# Patient Record
Sex: Female | Born: 1960
Health system: Southern US, Community
[De-identification: ages and names within clinical notes are randomized; demographics above are authoritative.]

## PROBLEM LIST (undated history)

## (undated) DIAGNOSIS — C4492 Squamous cell carcinoma of skin, unspecified: Secondary | ICD-10-CM

## (undated) DIAGNOSIS — C801 Malignant (primary) neoplasm, unspecified: Secondary | ICD-10-CM

## (undated) DIAGNOSIS — L570 Actinic keratosis: Secondary | ICD-10-CM

## (undated) HISTORY — PX: ABDOMINAL HYSTERECTOMY: SHX81

## (undated) HISTORY — DX: Squamous cell carcinoma of skin, unspecified: C44.92

## (undated) HISTORY — PX: OOPHORECTOMY: SHX86

## (undated) HISTORY — DX: Actinic keratosis: L57.0

---

## 1998-06-07 ENCOUNTER — Ambulatory Visit (HOSPITAL_COMMUNITY): Admission: RE | Admit: 1998-06-07 | Discharge: 1998-06-07 | Payer: Self-pay | Admitting: Cardiovascular Disease

## 1998-06-07 ENCOUNTER — Encounter: Payer: Self-pay | Admitting: Cardiovascular Disease

## 1998-07-12 ENCOUNTER — Inpatient Hospital Stay (HOSPITAL_COMMUNITY): Admission: RE | Admit: 1998-07-12 | Discharge: 1998-07-14 | Payer: Self-pay | Admitting: Obstetrics & Gynecology

## 1999-06-02 ENCOUNTER — Other Ambulatory Visit: Admission: RE | Admit: 1999-06-02 | Discharge: 1999-06-02 | Payer: Self-pay | Admitting: Obstetrics & Gynecology

## 2004-12-26 ENCOUNTER — Ambulatory Visit: Payer: Self-pay | Admitting: Internal Medicine

## 2005-01-25 ENCOUNTER — Ambulatory Visit: Payer: Self-pay | Admitting: Internal Medicine

## 2005-02-15 ENCOUNTER — Ambulatory Visit: Payer: Self-pay | Admitting: Unknown Physician Specialty

## 2006-10-19 ENCOUNTER — Emergency Department: Payer: Self-pay | Admitting: Emergency Medicine

## 2006-10-22 ENCOUNTER — Emergency Department: Payer: Self-pay | Admitting: Emergency Medicine

## 2006-10-25 ENCOUNTER — Ambulatory Visit: Payer: Self-pay | Admitting: Internal Medicine

## 2006-10-26 ENCOUNTER — Emergency Department: Payer: Self-pay | Admitting: Emergency Medicine

## 2006-11-02 ENCOUNTER — Emergency Department: Payer: Self-pay | Admitting: Emergency Medicine

## 2006-11-05 ENCOUNTER — Ambulatory Visit: Payer: Self-pay | Admitting: Internal Medicine

## 2006-11-16 ENCOUNTER — Emergency Department: Payer: Self-pay | Admitting: Emergency Medicine

## 2007-03-07 DIAGNOSIS — C801 Malignant (primary) neoplasm, unspecified: Secondary | ICD-10-CM

## 2007-03-07 HISTORY — DX: Malignant (primary) neoplasm, unspecified: C80.1

## 2007-05-30 ENCOUNTER — Ambulatory Visit: Payer: Self-pay | Admitting: Internal Medicine

## 2007-08-30 ENCOUNTER — Ambulatory Visit: Payer: Self-pay | Admitting: Physician Assistant

## 2008-06-01 ENCOUNTER — Ambulatory Visit: Payer: Self-pay | Admitting: Internal Medicine

## 2009-03-19 ENCOUNTER — Emergency Department: Payer: Self-pay

## 2009-12-09 ENCOUNTER — Ambulatory Visit: Payer: Self-pay | Admitting: Internal Medicine

## 2011-11-08 ENCOUNTER — Ambulatory Visit: Payer: Self-pay | Admitting: Internal Medicine

## 2011-11-16 ENCOUNTER — Ambulatory Visit: Payer: Self-pay | Admitting: Otolaryngology

## 2011-11-16 LAB — CREATININE, SERUM
Creatinine: 0.58 mg/dL — ABNORMAL LOW (ref 0.60–1.30)
EGFR (Non-African Amer.): >60

## 2012-10-18 ENCOUNTER — Other Ambulatory Visit: Payer: Self-pay | Admitting: *Deleted

## 2012-10-18 MED ORDER — LOVASTATIN 20 MG PO TABS: 20.0000 mg | ORAL_TABLET | Freq: Every day | ORAL | Status: DC

## 2012-12-03 ENCOUNTER — Other Ambulatory Visit: Payer: Self-pay | Admitting: *Deleted

## 2012-12-09 ENCOUNTER — Other Ambulatory Visit: Payer: Self-pay | Admitting: *Deleted

## 2012-12-09 ENCOUNTER — Other Ambulatory Visit: Payer: Self-pay

## 2012-12-09 ENCOUNTER — Ambulatory Visit: Payer: Self-pay | Admitting: Endocrinology

## 2012-12-09 MED ORDER — METFORMIN HCL 850 MG PO TABS: 850.0000 mg | ORAL_TABLET | Freq: Two times a day (BID) | ORAL | Status: DC

## 2012-12-11 ENCOUNTER — Encounter: Payer: Self-pay | Admitting: *Deleted

## 2012-12-18 ENCOUNTER — Other Ambulatory Visit: Payer: Self-pay

## 2012-12-30 ENCOUNTER — Encounter: Payer: Self-pay | Admitting: Endocrinology

## 2012-12-30 ENCOUNTER — Ambulatory Visit (INDEPENDENT_AMBULATORY_CARE_PROVIDER_SITE_OTHER): Payer: 59 | Admitting: Endocrinology

## 2012-12-30 ENCOUNTER — Other Ambulatory Visit (INDEPENDENT_AMBULATORY_CARE_PROVIDER_SITE_OTHER): Payer: 59

## 2012-12-30 VITALS — BP 130/82 | HR 76 | Temp 98.3°F | Resp 10 | Ht 66.0 in | Wt 207.5 lb

## 2012-12-30 DIAGNOSIS — IMO0001 Reserved for inherently not codable concepts without codable children: Secondary | ICD-10-CM

## 2012-12-30 DIAGNOSIS — H8109 Meniere's disease, unspecified ear: Secondary | ICD-10-CM

## 2012-12-30 DIAGNOSIS — E785 Hyperlipidemia, unspecified: Secondary | ICD-10-CM | POA: Insufficient documentation

## 2012-12-30 LAB — MICROALBUMIN / CREATININE URINE RATIO
Creatinine,U: 120.1 mg/dL
Microalb Creat Ratio: 0.7 mg/g (ref 0.0–30.0)

## 2012-12-30 LAB — COMPREHENSIVE METABOLIC PANEL
ALT: 15 U/L (ref 0–35)
AST: 15 U/L (ref 0–37)
Albumin: 4.6 g/dL (ref 3.5–5.2)
BUN: 17 mg/dL (ref 6–23)
CO2: 27 mEq/L (ref 19–32)
Calcium: 9.8 mg/dL (ref 8.4–10.5)
Chloride: 102 mEq/L (ref 96–112)
Creatinine, Ser: 0.6 mg/dL (ref 0.4–1.2)
GFR: 112.88 mL/min (ref >60.00)
Potassium: 4.3 mEq/L (ref 3.5–5.1)

## 2012-12-30 LAB — URINALYSIS
Bilirubin Urine: NEGATIVE
Hgb urine dipstick: NEGATIVE
Ketones, ur: NEGATIVE
Leukocytes, UA: NEGATIVE
Nitrite: NEGATIVE
pH: 5.5 (ref 5.0–8.0)

## 2012-12-30 NOTE — Progress Notes (Signed)
Patient ID: Carrie Soto, female   DOB: 01-10-1961, 52 y.o.   MRN: [id number]  Carrie Soto is an 52 y.o. female.   Reason for Appointment: Diabetes follow-up   History of Present Illness   Diagnosis: Type 2 DIABETES MELITUS, date of diagnosis:  2007     Previous history: She was initially treated with Actos and glipizide but because of poor control she was changed to Actoplusmet, Amaryl and Onglyza With this her blood sugars have been reasonably well-controlled On her last visit she was having high readings because of taking prednisone but detailed records are not available  Recent history: Earlier this year she was given Invokana also especially because of her difficulty with losing weight She thinks her blood sugars have been overall better and on her own she stopped Onglyza She thinks that blood sugars are occasionally slightly low but her only symptoms will be feeling irritable and then she will eat something And lowest recorded glucose recently is 63 Her blood sugars are not higher even when she takes an occasional prednisone for her Mnire's disease She thinks her weight has gone down about 2 pounds  Oral hypoglycemic drugs: Actoplusmet, metformin and Invokana        Side effects from medications: None         Proper timing of medications in relation to meals: Yes.          Monitors blood glucose: Once a day.    Glucometer: One Touch.          Blood Glucose readings from meter download: readings before breakfast: 92-120, midday 70-129, 5 PM = 63  Hypoglycemia frequency: Very occasionally with minimal symptoms, no shakiness or sweating      Meals: 3 meals per day.          Physical activity: exercise: Walking 2/7 days a week on treadmill           Dietician visit: Most recent: 04/2009             Wt Readings from Last 3 Encounters:  12/30/12 207 lb 8 oz (94.121 kg)    LABS:  No results found for any previous visit.    Medication List       This list is  accurate as of: 12/30/12  1:59 PM.  Always use your most recent med list.               ACTOPLUS MET XR 15-1000 MG Tb24  Generic drug:  Pioglitazone HCl-Metformin HCl     amoxicillin 875 MG tablet  Commonly known as:  AMOXIL     aspirin 81 MG tablet  Take 81 mg by mouth daily.     INVOKANA 300 MG Tabs  Generic drug:  Canagliflozin  300 mg.     lovastatin 20 MG tablet  Commonly known as:  MEVACOR  Take 1 tablet (20 mg total) by mouth at bedtime.     metFORMIN 850 MG tablet  Commonly known as:  GLUCOPHAGE  Take 1 tablet (850 mg total) by mouth 2 (two) times daily with a meal.     multivitamin with minerals Tabs tablet  Take 1 tablet by mouth daily.     promethazine 12.5 MG tablet  Commonly known as:  PHENERGAN  12.5 mg.        Allergies: No Known Allergies  No past medical history on file.  No past surgical history on file.  No family history on file.  Social History:  reports  that she has never smoked. She has never used smokeless tobacco. Her alcohol and drug histories are not on file.  Review of Systems:  Hypertension:  has not been on any treatment for this  Lipids: Has been treated with lovastatin 20 mg  Meniere's disease: She has had occasional symptoms      Examination:   BP 130/82  Pulse 76  Temp(Src) 98.3 F (36.8 C)  Resp 10  Ht 5\' 6"  (1.676 m)  Wt 207 lb 8 oz (94.121 kg)  BMI 33.51 kg/m2  SpO2 98%  Body mass index is 33.51 kg/(m^2).    ASSESSMENT/ PLAN::   Diabetes type 2   Blood glucose control appears excellent with mostly normal readings at home Because of her tendency to hypoglycemia will stop her additional metformin tablet She is doing well with her diet and exercise regimen and her weight is overall better than over the last few years with adding Invokana  Libby Goehring 12/30/2012, 1:59 PM   Addendum: Labs as follows  Appointment on 12/30/2012  Component Date Value Range Status  . Color, Urine 12/30/2012 LT. YELLOW   Yellow;Lt. Yellow Final  . APPearance 12/30/2012 CLEAR  Clear Final  . Specific Gravity, Urine 12/30/2012 1.025  1.000-1.030 Final  . pH 12/30/2012 5.5  5.0 - 8.0 Final  . Total Protein, Urine 12/30/2012 NEGATIVE  Negative Final  . Urine Glucose 12/30/2012 >=1000  Negative Final  . Ketones, ur 12/30/2012 NEGATIVE  Negative Final  . Bilirubin Urine 12/30/2012 NEGATIVE  Negative Final  . Hgb urine dipstick 12/30/2012 NEGATIVE  Negative Final  . Urobilinogen, UA 12/30/2012 0.2  0.0 - 1.0 Final  . Leukocytes, UA 12/30/2012 NEGATIVE  Negative Final  . Nitrite 12/30/2012 NEGATIVE  Negative Final  . Microalb, Ur 12/30/2012 0.9  0.0 - 1.9 mg/dL Final  . Creatinine,U 16/12/9602 120.1   Final  . Microalb Creat Ratio 12/30/2012 0.7  0.0 - 30.0 mg/g Final  . Hemoglobin A1C 12/30/2012 6.1  4.6 - 6.5 % Final   Glycemic Control Guidelines for People with Diabetes:Non Diabetic:  <6%Goal of Therapy: <7%Additional Action Suggested:  >8%   . Sodium 12/30/2012 139  135 - 145 mEq/L Final  . Potassium 12/30/2012 4.3  3.5 - 5.1 mEq/L Final  . Chloride 12/30/2012 102  96 - 112 mEq/L Final  . CO2 12/30/2012 27  19 - 32 mEq/L Final  . Glucose, Bld 12/30/2012 115* 70 - 99 mg/dL Final  . BUN 54/11/8117 17  6 - 23 mg/dL Final  . Creatinine, Ser 12/30/2012 0.6  0.4 - 1.2 mg/dL Final  . Total Bilirubin 12/30/2012 2.0* 0.3 - 1.2 mg/dL Final  . Alkaline Phosphatase 12/30/2012 71  39 - 117 U/L Final  . AST 12/30/2012 15  0 - 37 U/L Final  . ALT 12/30/2012 15  0 - 35 U/L Final  . Total Protein 12/30/2012 7.2  6.0 - 8.3 g/dL Final  . Albumin 14/78/2956 4.6  3.5 - 5.2 g/dL Final  . Calcium 21/30/8657 9.8  8.4 - 10.5 mg/dL Final  . GFR 84/69/6295 112.88  >60.00 mL/min Final

## 2012-12-30 NOTE — Patient Instructions (Signed)
Stop Metformin as long as am sugar <130  Exercise daily

## 2012-12-31 ENCOUNTER — Telehealth: Payer: Self-pay | Admitting: *Deleted

## 2012-12-31 DIAGNOSIS — H8109 Meniere's disease, unspecified ear: Secondary | ICD-10-CM | POA: Insufficient documentation

## 2012-12-31 NOTE — Progress Notes (Signed)
Quick Note:  Please let patient know that the A1c result is normal and no further action needed ______

## 2012-12-31 NOTE — Telephone Encounter (Signed)
Message copied by Hermenia Bers on Tue Dec 31, 2012 11:54 AM ------      Message from: Reather Littler      Created: Tue Dec 31, 2012 11:34 AM       Please let patient know that the A1c result is normal and no further action needed ------

## 2012-12-31 NOTE — Telephone Encounter (Signed)
Left message with sister to return call for results

## 2013-01-09 ENCOUNTER — Other Ambulatory Visit: Payer: Self-pay | Admitting: *Deleted

## 2013-01-09 ENCOUNTER — Telehealth: Payer: Self-pay | Admitting: Endocrinology

## 2013-01-09 MED ORDER — PIOGLITAZONE HCL-METFORMIN ER 15-1000 MG PO TB24: ORAL_TABLET | ORAL | Status: DC

## 2013-01-09 NOTE — Telephone Encounter (Signed)
rx sent

## 2013-01-16 ENCOUNTER — Encounter: Payer: Self-pay | Admitting: *Deleted

## 2013-01-21 ENCOUNTER — Other Ambulatory Visit: Payer: Self-pay | Admitting: *Deleted

## 2013-01-21 MED ORDER — PIOGLITAZONE HCL-METFORMIN ER 15-1000 MG PO TB24: ORAL_TABLET | ORAL | Status: DC

## 2013-03-03 ENCOUNTER — Other Ambulatory Visit: Payer: Self-pay | Admitting: *Deleted

## 2013-03-03 MED ORDER — CANAGLIFLOZIN 300 MG PO TABS: 300.0000 mg | ORAL_TABLET | Freq: Every day | ORAL | Status: DC

## 2013-03-31 ENCOUNTER — Ambulatory Visit: Payer: 59 | Admitting: Endocrinology

## 2013-03-31 ENCOUNTER — Other Ambulatory Visit: Payer: 59

## 2013-04-04 ENCOUNTER — Ambulatory Visit (INDEPENDENT_AMBULATORY_CARE_PROVIDER_SITE_OTHER): Payer: 59 | Admitting: Endocrinology

## 2013-04-04 ENCOUNTER — Encounter: Payer: Self-pay | Admitting: Endocrinology

## 2013-04-04 ENCOUNTER — Other Ambulatory Visit (INDEPENDENT_AMBULATORY_CARE_PROVIDER_SITE_OTHER): Payer: 59

## 2013-04-04 VITALS — BP 102/56 | HR 86 | Temp 98.3°F | Resp 16 | Ht 66.0 in | Wt 217.0 lb

## 2013-04-04 DIAGNOSIS — E785 Hyperlipidemia, unspecified: Secondary | ICD-10-CM

## 2013-04-04 DIAGNOSIS — E1165 Type 2 diabetes mellitus with hyperglycemia: Principal | ICD-10-CM

## 2013-04-04 DIAGNOSIS — IMO0001 Reserved for inherently not codable concepts without codable children: Secondary | ICD-10-CM

## 2013-04-04 LAB — BASIC METABOLIC PANEL
BUN: 19 mg/dL (ref 6–23)
CALCIUM: 9.4 mg/dL (ref 8.4–10.5)
CO2: 21 mEq/L (ref 19–32)
Chloride: 109 mEq/L (ref 96–112)
Creatinine, Ser: 0.9 mg/dL (ref 0.4–1.2)
GFR: 67.54 mL/min (ref >60.00)
Glucose, Bld: 168 mg/dL — ABNORMAL HIGH (ref 70–99)
Potassium: 3.8 mEq/L (ref 3.5–5.1)
SODIUM: 139 meq/L (ref 135–145)

## 2013-04-04 LAB — HEMOGLOBIN A1C: HEMOGLOBIN A1C: 6.1 % (ref 4.6–6.5)

## 2013-04-04 NOTE — Progress Notes (Signed)
Patient ID: Carrie Soto, female   DOB: [birth year], 53 y.o.   MRN: [id number]   Reason for Appointment: Diabetes follow-up   History of Present Illness   Diagnosis: Type 2 DIABETES MELITUS, date of diagnosis:  2007     Previous history: She was initially treated with Actos and glipizide but because of poor control she was changed to Actoplusmet, Amaryl and Onglyza With this her blood sugars have been reasonably well-controlled In 2014  she was given Invokana also mostly because of her difficulty with losing weight Blood blood sugar control was subsequently overall better and on her own she stopped Onglyza  Recent history:  Although her A1c generally stays in the normal range she tends to have periodic high readings Most of her high readings are when she takes prednisone for her Mnire's disease Recently has taken prednisone about 3 times in the last month, usually takes one dose at bedtime Her blood sugars appear to be higher the next day She has only 3 blood sugars on a monitor in the last month and not clear what her level of control is However has gained significant amount of weight from poor diet and not exercising On her last visit metformin was reduced by 500 mg because of symptoms suggestive of low blood sugars occasionally and she has not had these symptoms  Oral hypoglycemic drugs: Actoplusmet, metformin and Invokana        Side effects from medications: None              Monitors blood glucose: Once a day.    Glucometer: One Touch.          Blood Glucose readings from meter download: Range 123-232 in the afternoon with only one high reading  Hypoglycemia: None recently Meals: 3 meals per day.          Physical activity: exercise: Walking rarely on treadmill           Dietician visit: Most recent: 04/2009             Wt Readings from Last 3 Encounters:  04/04/13 217 lb (98.431 kg)  12/30/12 207 lb 8 oz (94.121 kg)    LABS:  Lab Results  Component Value Date    HGBA1C 6.1 04/04/2013   HGBA1C 6.1 12/30/2012   Lab Results  Component Value Date   MICROALBUR 0.9 12/30/2012   CREATININE 0.9 04/04/2013    Appointment on 04/04/2013  Component Date Value Range Status  . Hemoglobin A1C 04/04/2013 6.1  4.6 - 6.5 % Final   Glycemic Control Guidelines for People with Diabetes:Non Diabetic:  <6%Goal of Therapy: <7%Additional Action Suggested:  >8%   . Sodium 04/04/2013 139  135 - 145 mEq/L Final  . Potassium 04/04/2013 3.8  3.5 - 5.1 mEq/L Final  . Chloride 04/04/2013 109  96 - 112 mEq/L Final  . CO2 04/04/2013 21  19 - 32 mEq/L Final  . Glucose, Bld 04/04/2013 168* 70 - 99 mg/dL Final  . BUN 04/04/2013 19  6 - 23 mg/dL Final  . Creatinine, Ser 04/04/2013 0.9  0.4 - 1.2 mg/dL Final  . Calcium 04/04/2013 9.4  8.4 - 10.5 mg/dL Final  . GFR 04/04/2013 67.54  >60.00 mL/min Final      Medication List       This list is accurate as of: 04/04/13 11:59 PM.  Always use your most recent med list.               aspirin 81  MG tablet  Take 81 mg by mouth daily.     Canagliflozin 300 MG Tabs  Commonly known as:  INVOKANA  Take 1 tablet (300 mg total) by mouth daily.     lovastatin 20 MG tablet  Commonly known as:  MEVACOR  Take 1 tablet (20 mg total) by mouth at bedtime.     multivitamin with minerals Tabs tablet  Take 1 tablet by mouth daily.     Pioglitazone HCl-Metformin HCl 15-1000 MG Tb24  Commonly known as:  ACTOPLUS MET XR  Take one tablet by mouth with evening meal     predniSONE 10 MG tablet  Commonly known as:  DELTASONE  Take 10 mg by mouth as needed.     promethazine 12.5 MG tablet  Commonly known as:  PHENERGAN  12.5 mg.        Allergies: No Known Allergies  No past medical history on file.  No past surgical history on file.  No family history on file.  Social History:  reports that she has never smoked. She has never used smokeless tobacco. Her alcohol and drug histories are not on file.  Review of  Systems:  Hypertension:  not present  Lipids: Has been treated with lovastatin 20 mg, no recent lipids available  Meniere's disease: She has had occasional symptoms and appears to get better with steroids      Examination:   BP 102/56  Pulse 86  Temp(Src) 98.3 F (36.8 C)  Resp 16  Ht $R'5\' 6"'pW$  (1.676 m)  Wt 217 lb (98.431 kg)  BMI 35.04 kg/m2  SpO2 95%  Body mass index is 35.04 kg/(m^2).    ASSESSMENT/ PLAN:   Diabetes type 2:   Blood glucose control needs to be assessed with A1c today She has gained weight and discussed that this may potentially cause worsening of her diabetes control This is despite her continuing to take Invokana which had previously helped with weight management She does need to get back into a routine of regular exercise and start watching her caloric intake  Control hyperglycemia related to prednisone keep a prescription of Amaryl 2 mg filled and will take the medication once or twice after taking the prednisone depending on the duration of hyperglycemia Advised her to call if she has persistently high readings  Hypercholesterolemia: To have lipids checked by PCP  [facility] 04/06/2013, 8:56 PM   Addendum: Labs as follows  Appointment on 04/04/2013  Component Date Value Range Status  . Hemoglobin A1C 04/04/2013 6.1  4.6 - 6.5 % Final   Glycemic Control Guidelines for People with Diabetes:Non Diabetic:  <6%Goal of Therapy: <7%Additional Action Suggested:  >8%   . Sodium 04/04/2013 139  135 - 145 mEq/L Final  . Potassium 04/04/2013 3.8  3.5 - 5.1 mEq/L Final  . Chloride 04/04/2013 109  96 - 112 mEq/L Final  . CO2 04/04/2013 21  19 - 32 mEq/L Final  . Glucose, Bld 04/04/2013 168* 70 - 99 mg/dL Final  . BUN 04/04/2013 19  6 - 23 mg/dL Final  . Creatinine, Ser 04/04/2013 0.9  0.4 - 1.2 mg/dL Final  . Calcium 04/04/2013 9.4  8.4 - 10.5 mg/dL Final  . GFR 04/04/2013 67.54  >60.00 mL/min Final

## 2013-04-04 NOTE — Patient Instructions (Addendum)
Please check blood sugars at least half the time about 2 hours after any meal and as directed on waking up. Please bring blood sugar monitor to each visit Restart exercise Glimeperide 2mg , take 1 twice a day as needed

## 2013-04-06 MED ORDER — GLIMEPIRIDE 2 MG PO TABS: 2.0000 mg | ORAL_TABLET | Freq: Every day | ORAL | Status: DC

## 2013-04-06 NOTE — Progress Notes (Signed)
Quick Note:  Please let patient know that the A1c same at 6.1, glucose 168, needs to monitor more often ______

## 2013-05-28 ENCOUNTER — Ambulatory Visit: Payer: Self-pay | Admitting: Internal Medicine

## 2013-06-06 ENCOUNTER — Ambulatory Visit: Payer: Self-pay | Admitting: Internal Medicine

## 2013-07-03 ENCOUNTER — Ambulatory Visit: Payer: 59 | Admitting: Endocrinology

## 2013-07-03 ENCOUNTER — Other Ambulatory Visit: Payer: 59

## 2013-08-05 ENCOUNTER — Other Ambulatory Visit: Payer: 59

## 2013-08-05 ENCOUNTER — Ambulatory Visit: Payer: 59 | Admitting: Endocrinology

## 2013-09-03 ENCOUNTER — Other Ambulatory Visit: Payer: 59

## 2013-09-03 ENCOUNTER — Ambulatory Visit: Payer: 59 | Admitting: Endocrinology

## 2013-10-08 ENCOUNTER — Ambulatory Visit: Payer: 59 | Admitting: Endocrinology

## 2013-10-08 ENCOUNTER — Encounter: Payer: Self-pay | Admitting: *Deleted

## 2013-10-08 ENCOUNTER — Other Ambulatory Visit: Payer: 59

## 2013-10-08 DIAGNOSIS — Z0289 Encounter for other administrative examinations: Secondary | ICD-10-CM

## 2013-10-27 ENCOUNTER — Other Ambulatory Visit: Payer: Self-pay | Admitting: *Deleted

## 2013-10-27 MED ORDER — LOVASTATIN 20 MG PO TABS: 20.0000 mg | ORAL_TABLET | Freq: Every day | ORAL | Status: DC

## 2013-10-31 ENCOUNTER — Other Ambulatory Visit (INDEPENDENT_AMBULATORY_CARE_PROVIDER_SITE_OTHER): Payer: 59

## 2013-10-31 DIAGNOSIS — E1165 Type 2 diabetes mellitus with hyperglycemia: Principal | ICD-10-CM

## 2013-10-31 DIAGNOSIS — IMO0001 Reserved for inherently not codable concepts without codable children: Secondary | ICD-10-CM

## 2013-10-31 LAB — COMPREHENSIVE METABOLIC PANEL
ALBUMIN: 4.3 g/dL (ref 3.5–5.2)
ALT: 19 U/L (ref 0–35)
AST: 18 U/L (ref 0–37)
Alkaline Phosphatase: 71 U/L (ref 39–117)
BUN: 19 mg/dL (ref 6–23)
CO2: 28 mEq/L (ref 19–32)
Calcium: 9.7 mg/dL (ref 8.4–10.5)
Chloride: 105 mEq/L (ref 96–112)
Creatinine, Ser: 0.7 mg/dL (ref 0.4–1.2)
GFR: 86.64 mL/min (ref >60.00)
Glucose, Bld: 119 mg/dL — ABNORMAL HIGH (ref 70–99)
Potassium: 4.3 mEq/L (ref 3.5–5.1)
Sodium: 140 mEq/L (ref 135–145)
Total Bilirubin: 2.8 mg/dL — ABNORMAL HIGH (ref 0.2–1.2)
Total Protein: 6.9 g/dL (ref 6.0–8.3)

## 2013-10-31 LAB — URINALYSIS, ROUTINE W REFLEX MICROSCOPIC
BILIRUBIN URINE: NEGATIVE
HGB URINE DIPSTICK: NEGATIVE
Ketones, ur: NEGATIVE
Leukocytes, UA: NEGATIVE
Nitrite: NEGATIVE
Specific Gravity, Urine: 1.02 (ref 1.000–1.030)
Total Protein, Urine: NEGATIVE
Urobilinogen, UA: 0.2 (ref 0.0–1.0)
pH: 5.5 (ref 5.0–8.0)

## 2013-10-31 LAB — HEMOGLOBIN A1C: Hgb A1c MFr Bld: 6 % (ref 4.6–6.5)

## 2013-11-06 ENCOUNTER — Encounter: Payer: Self-pay | Admitting: Endocrinology

## 2013-11-06 ENCOUNTER — Ambulatory Visit (INDEPENDENT_AMBULATORY_CARE_PROVIDER_SITE_OTHER): Payer: 59 | Admitting: Endocrinology

## 2013-11-06 VITALS — BP 116/68 | HR 85 | Temp 97.7°F | Resp 16 | Ht 63.0 in | Wt 216.4 lb

## 2013-11-06 DIAGNOSIS — E785 Hyperlipidemia, unspecified: Secondary | ICD-10-CM

## 2013-11-06 DIAGNOSIS — Z23 Encounter for immunization: Secondary | ICD-10-CM

## 2013-11-06 DIAGNOSIS — IMO0001 Reserved for inherently not codable concepts without codable children: Secondary | ICD-10-CM

## 2013-11-06 DIAGNOSIS — E1165 Type 2 diabetes mellitus with hyperglycemia: Principal | ICD-10-CM

## 2013-11-06 NOTE — Progress Notes (Signed)
Patient ID: Carrie Soto, female   DOB: 01/10/1961, 53 y.o.   MRN: [id number]   Reason for Appointment: Diabetes follow-up   History of Present Illness   Diagnosis: Type 2 DIABETES MELITUS, date of diagnosis:  2007     Previous history: She was initially treated with Actos and glipizide but because of poor control she was changed to Actoplusmet, Amaryl and Onglyza With this her blood sugars have been reasonably well-controlled In 2014  she was given Invokana also mostly because of her difficulty with losing weight Blood blood sugar control was subsequently overall better and on her own she stopped Onglyza  Recent history:  Has not been seen in followup since 1/15 She says she has been involved with family issues and has not been able to do much glucose monitoring or exercise because of this However has not gained any weight A1c is still excellent She has only 4 blood sugars at home, all fasting and fairly good She is taking only low dose metformin once a day and has benefited from adding Invokana  Oral hypoglycemic drugs: Actoplusmet, and Invokana        Side effects from medications: None              Monitors blood glucose:  irregular.    Glucometer: One Touch.          Blood Glucose readings from meter download: Range 102-122, ALT before first meal  Hypoglycemia: None recently Meals: 3 meals per day.          Physical activity: exercise: Walking rarely on treadmill, recently starting back           Dietician visit: Most recent: 04/2009             Wt Readings from Last 3 Encounters:  11/06/13 216 lb 6.4 oz (98.158 kg)  04/04/13 217 lb (98.431 kg)  12/30/12 207 lb 8 oz (94.121 kg)    LABS:  Lab Results  Component Value Date   HGBA1C 6.0 10/31/2013   HGBA1C 6.1 04/04/2013   HGBA1C 6.1 12/30/2012   Lab Results  Component Value Date   MICROALBUR 0.9 12/30/2012   CREATININE 0.7 10/31/2013    Appointment on 10/31/2013  Component Date Value Ref Range Status  . Sodium  10/31/2013 140  135 - 145 mEq/L Final  . Potassium 10/31/2013 4.3  3.5 - 5.1 mEq/L Final  . Chloride 10/31/2013 105  96 - 112 mEq/L Final  . CO2 10/31/2013 28  19 - 32 mEq/L Final  . Glucose, Bld 10/31/2013 119* 70 - 99 mg/dL Final  . BUN 10/31/2013 19  6 - 23 mg/dL Final  . Creatinine, Ser 10/31/2013 0.7  0.4 - 1.2 mg/dL Final  . Total Bilirubin 10/31/2013 2.8* 0.2 - 1.2 mg/dL Final  . Alkaline Phosphatase 10/31/2013 71  39 - 117 U/L Final  . AST 10/31/2013 18  0 - 37 U/L Final  . ALT 10/31/2013 19  0 - 35 U/L Final  . Total Protein 10/31/2013 6.9  6.0 - 8.3 g/dL Final  . Albumin 10/31/2013 4.3  3.5 - 5.2 g/dL Final  . Calcium 10/31/2013 9.7  8.4 - 10.5 mg/dL Final  . GFR 10/31/2013 86.64  >60.00 mL/min Final  . Hemoglobin A1C 10/31/2013 6.0  4.6 - 6.5 % Final   Glycemic Control Guidelines for People with Diabetes:Non Diabetic:  <6%Goal of Therapy: <7%Additional Action Suggested:  >8%   . Color, Urine 10/31/2013 YELLOW  Yellow;Lt. Yellow Final  . APPearance 10/31/2013  CLEAR  Clear Final  . Specific Gravity, Urine 10/31/2013 1.020  1.000-1.030 Final  . pH 10/31/2013 5.5  5.0 - 8.0 Final  . Total Protein, Urine 10/31/2013 NEGATIVE  Negative Final  . Urine Glucose 10/31/2013 >=1000* Negative Final  . Ketones, ur 10/31/2013 NEGATIVE  Negative Final  . Bilirubin Urine 10/31/2013 NEGATIVE  Negative Final  . Hgb urine dipstick 10/31/2013 NEGATIVE  Negative Final  . Urobilinogen, UA 10/31/2013 0.2  0.0 - 1.0 Final  . Leukocytes, UA 10/31/2013 NEGATIVE  Negative Final  . Nitrite 10/31/2013 NEGATIVE  Negative Final  . WBC, UA 10/31/2013 0-2/hpf  0-2/hpf Final  . RBC / HPF 10/31/2013 0-2/hpf  0-2/hpf Final  . Mucus, UA 10/31/2013 Presence of* None Final  . Bacteria, UA 10/31/2013 Rare(<10/hpf)* None Final  . Granular Casts, UA 10/31/2013 Presence of* None Final      Medication List       This list is accurate as of: 11/06/13  2:46 PM.  Always use your most recent med list.                aspirin 81 MG tablet  Take 81 mg by mouth daily.     Canagliflozin 300 MG Tabs  Commonly known as:  INVOKANA  Take 1 tablet (300 mg total) by mouth daily.     lovastatin 20 MG tablet  Commonly known as:  MEVACOR  Take 1 tablet (20 mg total) by mouth at bedtime.     multivitamin with minerals Tabs tablet  Take 1 tablet by mouth daily.     Pioglitazone HCl-Metformin HCl 15-1000 MG Tb24  Commonly known as:  ACTOPLUS MET XR  Take one tablet by mouth with evening meal     predniSONE 10 MG tablet  Commonly known as:  DELTASONE  Take 10 mg by mouth as needed.     promethazine 12.5 MG tablet  Commonly known as:  PHENERGAN  12.5 mg.     vitamin C 1000 MG tablet  Take 1,000 mg by mouth daily.     VITAMIN D PO  Take 600 Units by mouth.        Allergies:  Allergies  Allergen Reactions  . Clarithromycin Diarrhea    No past medical history on file.  No past surgical history on file.  No family history on file.  Social History:  reports that she has never smoked. She has never used smokeless tobacco. Her alcohol and drug histories are not on file.  Review of Systems:  Eye exams normal, has been regular  Hypertension:  not present  Lipids: Has been treated with lovastatin 20 mg, no recent lipids available  LDL in 2/14= 77  Meniere's disease: She has had less symptoms, none for 6 months  and appears to get better with steroids      Examination:   BP 116/68  Pulse 85  Temp(Src) 97.7 F (36.5 C)  Resp 16  Ht _0  (1.6 m)  Wt 216 lb 6.4 oz (98.158 kg)  BMI 38.34 kg/m2  SpO2 94%  Body mass index is 38.34 kg/(m^2).    ASSESSMENT/ PLAN:   Diabetes type 2:   Blood glucose control  is excellent with upper normal A1c This is despite her not checking blood sugar, exercising regularly and inconsistent diet In the last month has been better overall and has not gained any weight back Also probably better controlled if not getting steroids in the last 6  months  Hypercholesterolemia: To have lipids checked by  PCP and she will bring her records with her  [facility] 11/06/2013, 2:46 PM

## 2013-11-06 NOTE — Patient Instructions (Signed)
Please check blood sugars at least half the time about 2 hours after any meal and times per week on waking up. Please bring blood sugar monitor to each visit

## 2013-11-07 ENCOUNTER — Other Ambulatory Visit: Payer: Self-pay | Admitting: *Deleted

## 2013-11-07 ENCOUNTER — Telehealth: Payer: Self-pay | Admitting: Endocrinology

## 2013-11-07 MED ORDER — GLUCOSE BLOOD VI STRP: ORAL_STRIP | Status: DC

## 2013-11-07 MED ORDER — LOVASTATIN 20 MG PO TABS: ORAL_TABLET | ORAL | Status: DC

## 2013-11-07 MED ORDER — ONETOUCH ULTRASOFT LANCETS MISC: Status: DC

## 2013-11-07 NOTE — Telephone Encounter (Signed)
Patient called stating she is at pharmacy and her prescriptions were never sent in  20 mg levostatin  Test strips and lancets   Pharmacy: [facility]   Thank you

## 2014-04-09 ENCOUNTER — Other Ambulatory Visit: Payer: Self-pay | Admitting: Endocrinology

## 2014-05-04 ENCOUNTER — Other Ambulatory Visit: Payer: Self-pay | Admitting: Endocrinology

## 2014-05-04 ENCOUNTER — Other Ambulatory Visit: Payer: Self-pay

## 2014-05-04 MED ORDER — CANAGLIFLOZIN 300 MG PO TABS
300.0000 mg | ORAL_TABLET | Freq: Every day | ORAL | Status: DC
Start: 2014-05-04 — End: 2014-11-10

## 2014-05-04 NOTE — Telephone Encounter (Signed)
Rx request for Invokana 300 mg tablet- Take 1 tablet by mouth every morning before eating.  #90  Pharm:  [facility] [address]   Rx sent to pharmacy.

## 2014-05-07 ENCOUNTER — Ambulatory Visit: Payer: 59 | Admitting: Endocrinology

## 2014-05-07 ENCOUNTER — Other Ambulatory Visit: Payer: 59

## 2014-06-03 ENCOUNTER — Other Ambulatory Visit (INDEPENDENT_AMBULATORY_CARE_PROVIDER_SITE_OTHER): Payer: 59

## 2014-06-03 ENCOUNTER — Encounter: Payer: Self-pay | Admitting: Endocrinology

## 2014-06-03 ENCOUNTER — Ambulatory Visit (INDEPENDENT_AMBULATORY_CARE_PROVIDER_SITE_OTHER): Payer: 59 | Admitting: Endocrinology

## 2014-06-03 VITALS — BP 132/84 | HR 81 | Temp 97.7°F | Ht 63.0 in | Wt 217.0 lb

## 2014-06-03 DIAGNOSIS — E1165 Type 2 diabetes mellitus with hyperglycemia: Secondary | ICD-10-CM | POA: Diagnosis not present

## 2014-06-03 DIAGNOSIS — E119 Type 2 diabetes mellitus without complications: Secondary | ICD-10-CM

## 2014-06-03 LAB — POCT URINALYSIS DIPSTICK
Bilirubin, UA: NEGATIVE
Glucose, UA: 1000
Ketones, UA: NEGATIVE
LEUKOCYTES UA: NEGATIVE
Nitrite, UA: NEGATIVE
PROTEIN UA: NEGATIVE
RBC UA: NEGATIVE
Spec Grav, UA: 1.015
Urobilinogen, UA: NEGATIVE
pH, UA: 5

## 2014-06-03 LAB — COMPREHENSIVE METABOLIC PANEL
ALT: 13 U/L (ref 0–35)
AST: 13 U/L (ref 0–37)
Albumin: 4.5 g/dL (ref 3.5–5.2)
Alkaline Phosphatase: 74 U/L (ref 39–117)
BILIRUBIN TOTAL: 2.2 mg/dL — AB (ref 0.2–1.2)
BUN: 23 mg/dL (ref 6–23)
CO2: 31 mEq/L (ref 19–32)
CREATININE: 0.73 mg/dL (ref 0.40–1.20)
Calcium: 9.9 mg/dL (ref 8.4–10.5)
Chloride: 104 mEq/L (ref 96–112)
GFR: 87.82 mL/min (ref >60.00)
Glucose, Bld: 107 mg/dL — ABNORMAL HIGH (ref 70–99)
POTASSIUM: 4 meq/L (ref 3.5–5.1)
Sodium: 137 mEq/L (ref 135–145)
Total Protein: 6.9 g/dL (ref 6.0–8.3)

## 2014-06-03 LAB — MICROALBUMIN / CREATININE URINE RATIO
CREATININE, U: 90.1 mg/dL
MICROALB UR: 0.9 mg/dL (ref 0.0–1.9)
MICROALB/CREAT RATIO: 1 mg/g (ref 0.0–30.0)

## 2014-06-03 LAB — HEMOGLOBIN A1C: HEMOGLOBIN A1C: 6 % (ref 4.6–6.5)

## 2014-06-03 NOTE — Progress Notes (Signed)
Patient ID: Carrie Soto, female   DOB: 04/01/1960, 54 y.o.   MRN: [id number]   Reason for Appointment: Diabetes follow-up   History of Present Illness   Diagnosis: Type 2 DIABETES MELITUS, date of diagnosis:  2007     Previous history: She was initially treated with Actos and glipizide but because of poor control she was changed to Actoplusmet, Amaryl and Onglyza With this her blood sugars have been reasonably well-controlled In 2014  she was given Invokana also mostly because of her difficulty with losing weight Blood blood sugar control was subsequently overall better and on her own she stopped Onglyza  Recent history:  Has not been seen in followup since 9/15 She generally has fairly stable upper normal A1c readings with current regimen of low-dose Actoplusmet along with Invokana Previously had benefited from [place] with better control and some weight loss which she has maintained She is again asking about stopping one of her medications She is not checking her blood sugar again as directed and has only a few morning readings She has just started doing a little exercise; diet is fair. Oral hypoglycemic drugs: Actoplusmet, and Invokana        Side effects from medications: None              Monitors blood glucose:  irregular.    Glucometer: One Touch.          Blood Glucose readings from meter download: Range 103-164 with only 5 readings and average 126           Physical activity: exercise: Walking some on treadmill, recently 2/7            Dietician visit: Most recent: 04/2009             Wt Readings from Last 3 Encounters:  06/03/14 217 lb (98.431 kg)  11/06/13 216 lb 6.4 oz (98.158 kg)  04/04/13 217 lb (98.431 kg)    LABS:  Lab Results  Component Value Date   HGBA1C 6.0 10/31/2013   HGBA1C 6.1 04/04/2013   HGBA1C 6.1 12/30/2012   Lab Results  Component Value Date   MICROALBUR 0.9 12/30/2012   CREATININE 0.7 10/31/2013    No visits with results within 1  Week(s) from this visit. Latest known visit with results is:  Appointment on 10/31/2013  Component Date Value Ref Range Status  . Sodium 10/31/2013 140  135 - 145 mEq/L Final  . Potassium 10/31/2013 4.3  3.5 - 5.1 mEq/L Final  . Chloride 10/31/2013 105  96 - 112 mEq/L Final  . CO2 10/31/2013 28  19 - 32 mEq/L Final  . Glucose, Bld 10/31/2013 119* 70 - 99 mg/dL Final  . BUN 10/31/2013 19  6 - 23 mg/dL Final  . Creatinine, Ser 10/31/2013 0.7  0.4 - 1.2 mg/dL Final  . Total Bilirubin 10/31/2013 2.8* 0.2 - 1.2 mg/dL Final  . Alkaline Phosphatase 10/31/2013 71  39 - 117 U/L Final  . AST 10/31/2013 18  0 - 37 U/L Final  . ALT 10/31/2013 19  0 - 35 U/L Final  . Total Protein 10/31/2013 6.9  6.0 - 8.3 g/dL Final  . Albumin 10/31/2013 4.3  3.5 - 5.2 g/dL Final  . Calcium 10/31/2013 9.7  8.4 - 10.5 mg/dL Final  . GFR 10/31/2013 86.64  >60.00 mL/min Final  . Hgb A1c MFr Bld 10/31/2013 6.0  4.6 - 6.5 % Final   Glycemic Control Guidelines for People with Diabetes:Non Diabetic:  <6%Goal of Therapy: <  7%Additional Action Suggested:  >8%   . Color, Urine 10/31/2013 YELLOW  Yellow;Lt. Yellow Final  . APPearance 10/31/2013 CLEAR  Clear Final  . Specific Gravity, Urine 10/31/2013 1.020  1.000-1.030 Final  . pH 10/31/2013 5.5  5.0 - 8.0 Final  . Total Protein, Urine 10/31/2013 NEGATIVE  Negative Final  . Urine Glucose 10/31/2013 >=1000* Negative Final  . Ketones, ur 10/31/2013 NEGATIVE  Negative Final  . Bilirubin Urine 10/31/2013 NEGATIVE  Negative Final  . Hgb urine dipstick 10/31/2013 NEGATIVE  Negative Final  . Urobilinogen, UA 10/31/2013 0.2  0.0 - 1.0 Final  . Leukocytes, UA 10/31/2013 NEGATIVE  Negative Final  . Nitrite 10/31/2013 NEGATIVE  Negative Final  . WBC, UA 10/31/2013 0-2/hpf  0-2/hpf Final  . RBC / HPF 10/31/2013 0-2/hpf  0-2/hpf Final  . Mucus, UA 10/31/2013 Presence of* None Final  . Bacteria, UA 10/31/2013 Rare(<10/hpf)* None Final  . Granular Casts, UA 10/31/2013 Presence of*  None Final      Medication List       This list is accurate as of: 06/03/14  4:04 PM.  Always use your most recent med list.               ACTOPLUS MET XR 15-1000 MG Tb24  Generic drug:  Pioglitazone HCl-Metformin HCl  TAKE ONE TABLET BY MOUTH WITH EVENING MEAL     aspirin 81 MG tablet  Take 81 mg by mouth daily.     canagliflozin 300 MG Tabs tablet  Commonly known as:  INVOKANA  Take 300 mg by mouth daily.     glucose blood test strip  Commonly known as:  ONE TOUCH ULTRA TEST  Use as instructed to check blood sugar 2 times per day     lovastatin 20 MG tablet  Commonly known as:  MEVACOR  TAKE ONE TABLET AT BEDTIME DAILY     multivitamin with minerals Tabs tablet  Take 1 tablet by mouth daily.     onetouch ultrasoft lancets  Use as instructed to check blood sugar 2 times per day     predniSONE 10 MG tablet  Commonly known as:  DELTASONE  Take 10 mg by mouth as needed.     promethazine 12.5 MG tablet  Commonly known as:  PHENERGAN  12.5 mg.     vitamin C 1000 MG tablet  Take 1,000 mg by mouth daily.     VITAMIN D PO  Take 600 Units by mouth.        Allergies:  Allergies  Allergen Reactions  . Clarithromycin Diarrhea    No past medical history on file.  No past surgical history on file.  No family history on file.  Social History:  reports that she has never smoked. She has never used smokeless tobacco. Her alcohol and drug histories are not on file.  Review of Systems:  Eye exams normal, has been regular  Hypertension:  not present  Lipids: Has been treated with lovastatin 20 mg, no recent lipids available  No results found for: CHOL, HDL, LDLCALC, LDLDIRECT, TRIG, CHOLHDL  LDL in 2/14= 77  Meniere's disease: She has had less symptoms, last episode 2 weeks ago  and appears to get better with steroids      Examination:   BP 132/84 mmHg  Pulse 81  Temp(Src) 97.7 F (36.5 C) (Oral)  Ht $R'5\' 3"'oO$  (1.6 m)  Wt 217 lb (98.431 kg)  BMI  38.45 kg/m2  SpO2 97%  Body mass index is 38.45 kg/(m^2).  ASSESSMENT/ PLAN:   Diabetes type 2:   Blood glucose control needs to be assessed with A1c Home blood sugars look fairly good and her weight is stable She is starting to exercise and encouraged her to do this more regularly Would like to continue on her 3 drug regimen since she really does have insulin resistance being treated with Actoplusmet and has had good response to glucose control and weight loss with Invokana Encouraged her to check sugars after meals and also more often when she takes steroids  Hypercholesterolemia: To have lipids checked   Patient Instructions  Please check blood sugars at least half the time about 2 hours after any meal and 3 times per week on waking up. Please bring blood sugar monitor to each visit. Recommended blood sugar levels about 2 hours after meal is 140-180 and on waking up 90-130      Darrin Koman 06/03/2014, 4:04 PM    Addendum: Labs as follows  Lab on 06/03/2014  Component Date Value Ref Range Status  . Hgb A1c MFr Bld 06/03/2014 6.0  4.6 - 6.5 % Final   Glycemic Control Guidelines for People with Diabetes:Non Diabetic:  <6%Goal of Therapy: <7%Additional Action Suggested:  >8%   . Sodium 06/03/2014 137  135 - 145 mEq/L Final  . Potassium 06/03/2014 4.0  3.5 - 5.1 mEq/L Final  . Chloride 06/03/2014 104  96 - 112 mEq/L Final  . CO2 06/03/2014 31  19 - 32 mEq/L Final  . Glucose, Bld 06/03/2014 107* 70 - 99 mg/dL Final  . BUN 06/03/2014 23  6 - 23 mg/dL Final  . Creatinine, Ser 06/03/2014 0.73  0.40 - 1.20 mg/dL Final  . Total Bilirubin 06/03/2014 2.2* 0.2 - 1.2 mg/dL Final  . Alkaline Phosphatase 06/03/2014 74  39 - 117 U/L Final  . AST 06/03/2014 13  0 - 37 U/L Final  . ALT 06/03/2014 13  0 - 35 U/L Final  . Total Protein 06/03/2014 6.9  6.0 - 8.3 g/dL Final  . Albumin 06/03/2014 4.5  3.5 - 5.2 g/dL Final  . Calcium 06/03/2014 9.9  8.4 - 10.5 mg/dL Final  . GFR 06/03/2014  87.82  >60.00 mL/min Final  . Color, UA 06/03/2014 Yellow   Final  . Clarity, UA 06/03/2014 Cloudy   Final  . Glucose, UA 06/03/2014 1000   Final  . Bilirubin, UA 06/03/2014 Neg   Final  . Ketones, UA 06/03/2014 Neg   Final  . Spec Grav, UA 06/03/2014 1.015   Final  . Blood, UA 06/03/2014 Neg   Final  . pH, UA 06/03/2014 5.0   Final  . Protein, UA 06/03/2014 Neg   Final  . Urobilinogen, UA 06/03/2014 negative   Final  . Nitrite, UA 06/03/2014 Neg   Final  . Leukocytes, UA 06/03/2014 Negative   Final  . Microalb, Ur 06/03/2014 0.9  0.0 - 1.9 mg/dL Final  . Creatinine,U 06/03/2014 90.1   Final  . Microalb Creat Ratio 06/03/2014 1.0  0.0 - 30.0 mg/g Final

## 2014-06-03 NOTE — Patient Instructions (Signed)
Please check blood sugars at least half the time about 2 hours after any meal and 3 times per week on waking up.  Please bring blood sugar monitor to each visit.  Recommended blood sugar levels about 2 hours after meal is 140-180 and on waking up 90-130   

## 2014-06-04 ENCOUNTER — Other Ambulatory Visit (INDEPENDENT_AMBULATORY_CARE_PROVIDER_SITE_OTHER): Payer: 59

## 2014-06-04 DIAGNOSIS — R937 Abnormal findings on diagnostic imaging of other parts of musculoskeletal system: Secondary | ICD-10-CM | POA: Diagnosis not present

## 2014-06-04 LAB — LIPID PANEL
CHOL/HDL RATIO: 3
Cholesterol: 170 mg/dL (ref 0–200)
HDL: 65.8 mg/dL (ref >39.00)
LDL Cholesterol: 71 mg/dL (ref 0–99)
NonHDL: 104.2
Triglycerides: 164 mg/dL — ABNORMAL HIGH (ref 0.0–149.0)
VLDL: 32.8 mg/dL (ref 0.0–40.0)

## 2014-06-06 NOTE — Progress Notes (Signed)
Quick Note:  Please let patient know that the lab result is normal and no further action needed, fax to PCP ______

## 2014-06-14 NOTE — Progress Notes (Signed)
Quick Note:  Please let patient know that the A1c is excellent at 6% and kidneys are okay Forward to PCP ______

## 2014-09-23 ENCOUNTER — Other Ambulatory Visit: Payer: Self-pay | Admitting: Endocrinology

## 2014-10-30 ENCOUNTER — Other Ambulatory Visit: Payer: Self-pay | Admitting: Endocrinology

## 2014-11-10 ENCOUNTER — Other Ambulatory Visit: Payer: Self-pay | Admitting: *Deleted

## 2014-11-10 MED ORDER — CANAGLIFLOZIN 300 MG PO TABS: 300.0000 mg | ORAL_TABLET | Freq: Every day | ORAL | Status: DC

## 2014-12-04 ENCOUNTER — Telehealth: Payer: Self-pay | Admitting: Endocrinology

## 2014-12-04 ENCOUNTER — Other Ambulatory Visit: Payer: Self-pay | Admitting: *Deleted

## 2014-12-04 ENCOUNTER — Ambulatory Visit (INDEPENDENT_AMBULATORY_CARE_PROVIDER_SITE_OTHER): Payer: 59 | Admitting: Endocrinology

## 2014-12-04 ENCOUNTER — Encounter: Payer: Self-pay | Admitting: Endocrinology

## 2014-12-04 VITALS — BP 120/76 | HR 70 | Temp 98.2°F | Resp 16 | Ht 66.0 in | Wt 223.0 lb

## 2014-12-04 DIAGNOSIS — Z23 Encounter for immunization: Secondary | ICD-10-CM | POA: Diagnosis not present

## 2014-12-04 DIAGNOSIS — E1165 Type 2 diabetes mellitus with hyperglycemia: Secondary | ICD-10-CM

## 2014-12-04 DIAGNOSIS — E559 Vitamin D deficiency, unspecified: Secondary | ICD-10-CM

## 2014-12-04 DIAGNOSIS — E119 Type 2 diabetes mellitus without complications: Secondary | ICD-10-CM

## 2014-12-04 DIAGNOSIS — IMO0002 Reserved for concepts with insufficient information to code with codable children: Secondary | ICD-10-CM

## 2014-12-04 LAB — BASIC METABOLIC PANEL
BUN: 20 mg/dL (ref 6–23)
CHLORIDE: 106 meq/L (ref 96–112)
CO2: 27 mEq/L (ref 19–32)
CREATININE: 0.68 mg/dL (ref 0.40–1.20)
Calcium: 9.1 mg/dL (ref 8.4–10.5)
GFR: 95.14 mL/min (ref >60.00)
Glucose, Bld: 128 mg/dL — ABNORMAL HIGH (ref 70–99)
Potassium: 4.3 mEq/L (ref 3.5–5.1)
Sodium: 140 mEq/L (ref 135–145)

## 2014-12-04 LAB — POCT GLYCOSYLATED HEMOGLOBIN (HGB A1C): HEMOGLOBIN A1C: 5.6

## 2014-12-04 LAB — VITAMIN D 25 HYDROXY (VIT D DEFICIENCY, FRACTURES): VITD: 46.1 ng/mL (ref 30.00–100.00)

## 2014-12-04 MED ORDER — GLUCOSE BLOOD VI STRP: ORAL_STRIP | Status: DC

## 2014-12-04 MED ORDER — ONETOUCH DELICA LANCING DEV MISC: Status: DC

## 2014-12-04 NOTE — Progress Notes (Signed)
Patient ID: Carrie Soto, female   DOB: 18-May-1960, 54 y.o.   MRN: [id number]   Reason for Appointment: Diabetes follow-up   History of Present Illness   Diagnosis: Type 2 DIABETES MELITUS, date of diagnosis:  2007     Previous history: She was initially treated with Actos and glipizide but because of poor control she was changed to Actoplusmet, Amaryl and Onglyza With this her blood sugars have been reasonably well-controlled In 2014  she was given Invokana also mostly because of her difficulty with losing weight Blood blood sugar control was subsequently overall better and on her own she stopped Onglyza  Recent history:  She generally has fairly stable upper normal A1c readings with current regimen of low-dose Actoplusmet along with Invokana A1c is readily lower than before However she has gained weight probably from inadequate exercise She thinks she is cutting back her portions Previously had benefited from [place] with better control and some weight loss  She is again reluctant to take medications and about 2 days ago because she thought her blood sugars were normal she stopped taking them  Oral hypoglycemic drugs: Actoplusmet, and Invokana        Side effects from medications: None              Monitors blood glucose:  every other day or so on average recently .    Glucometer: One Touch.          Blood Glucose readings  from meter download:  Mean values apply above for all meters except median for One Touch  PRE-MEAL Fasting Lunch Dinner Bedtime Overall  Glucose range:  105-127   98   88   104-139    Mean/median:  119      119           Physical activity: exercise: No formal exercise, just taking out the dog was Walking some on treadmill, recently 2/7            Dietician visit: Most recent: 04/2009             Wt Readings from Last 3 Encounters:  12/04/14 223 lb (101.152 kg)  06/03/14 217 lb (98.431 kg)  11/06/13 216 lb 6.4 oz (98.158 kg)    LABS:     Lab Results  Component Value Date   HGBA1C 5.6 12/04/2014   HGBA1C 6.0 06/03/2014   HGBA1C 6.0 10/31/2013   Lab Results  Component Value Date   MICROALBUR 0.9 06/03/2014   LDLCALC 71 06/04/2014   CREATININE 0.73 06/03/2014    Office Visit on 12/04/2014  Component Date Value Ref Range Status  . Hemoglobin A1C 12/04/2014 5.6   Final      Medication List       This list is accurate as of: 12/04/14 10:33 AM.  Always use your most recent med list.               ACTOPLUS MET XR 15-1000 MG Tb24  Generic drug:  Pioglitazone HCl-Metformin HCl  TAKE ONE TABLET BY MOUTH WITH EVENING MEAL     aspirin 81 MG tablet  Take 81 mg by mouth daily.     canagliflozin 300 MG Tabs tablet  Commonly known as:  INVOKANA  Take 300 mg by mouth daily.     glucose blood test strip  Commonly known as:  ONE TOUCH ULTRA TEST  Use as instructed to check blood sugar 2 times per day Dx code E11.65  lovastatin 20 MG tablet  Commonly known as:  MEVACOR  TAKE ONE TABLET AT BEDTIME DAILY     ONE TOUCH DELICA LANCING DEV Misc  Use to check blood sugar 2 times per day dx code E11.65     onetouch ultrasoft lancets  Use as instructed to check blood sugar 2 times per day     predniSONE 10 MG tablet  Commonly known as:  DELTASONE  Take 10 mg by mouth as needed.     promethazine 12.5 MG tablet  Commonly known as:  PHENERGAN  12.5 mg.     vitamin C  1000 MG tablet  Take 1,000 mg by mouth daily.     VITAMIN D PO  Take 600 Units by mouth.        Allergies:  Allergies  Allergen Reactions  . Clarithromycin Diarrhea    History reviewed. No pertinent past medical history.  No past surgical history on file.  Family History  Problem Relation Age of Onset  . Diabetes Neg Hx     Social History:  reports that she has never smoked. She has never used smokeless tobacco. Her alcohol and drug histories are not on file.  Review of Systems:  Eye exams normal, has been regular  Hypertension:  not present, blood pressure elsewhere has been about 120/76-80  Lipids: Has been treated with lovastatin 20 mg   Lab Results  Component Value Date   CHOL 170 06/04/2014   HDL 65.80 06/04/2014   LDLCALC 71 06/04/2014   TRIG 164.0* 06/04/2014   CHOLHDL 3 06/04/2014      Meniere's disease: She has had less symptoms, still occasionally taking steroids      Examination:   BP 120/76 mmHg  Pulse 70  Temp(Src) 98.2 F (36.8 C) (Oral)  Resp 16  Ht 5' 6" (1.676 m)  Wt 223 lb (101.152 kg)  BMI 36.01 kg/m2  SpO2 98%  Body mass index is 36.01 kg/(m^2).    ASSESSMENT/ PLAN:   Diabetes type 2:   Blood glucose control is excellent and her A1c is quite normal She has gained weight and blood sugars are a year to control will for now stop Actoplusmet Continue Invokana as it has helped her with overall control and minimizing weight gain as well as possibly helping reduce any tendency to hypertension  She does need to start a regular exercise program She will call if blood sugars are not well-controlled  Patient Instructions  May leave off actoplusmet  Check blood sugars on waking up .Marland Kitchen2-3  .Marland Kitchen times a week Also check blood sugars about 2 hours after a meal and do this after different meals by rotation  Recommended blood sugar levels on waking up is 90-120 and about 2 hours after meal is 120-150 Please bring blood sugar monitor to  each visit.    influenza vaccine given   [facility] 12/04/2014, 10:33 AM

## 2014-12-04 NOTE — Telephone Encounter (Signed)
Patient need a prescription  for One touch delica pen needles

## 2014-12-04 NOTE — Patient Instructions (Addendum)
May leave off actoplusmet  Check blood sugars on waking up .Marland Kitchen2-3  .Marland Kitchen times a week Also check blood sugars about 2 hours after a meal and do this after different meals by rotation  Recommended blood sugar levels on waking up is 90-120 and about 2 hours after meal is 120-150 Please bring blood sugar monitor to each visit.

## 2014-12-08 NOTE — Progress Notes (Signed)
Quick Note:  Please let patient know that the lab results are normal including vitamin D level ______

## 2014-12-11 ENCOUNTER — Encounter: Payer: Self-pay | Admitting: *Deleted

## 2014-12-17 ENCOUNTER — Telehealth: Payer: Self-pay | Admitting: Endocrinology

## 2014-12-17 ENCOUNTER — Other Ambulatory Visit: Payer: Self-pay | Admitting: *Deleted

## 2014-12-17 MED ORDER — ONETOUCH DELICA LANCETS FINE MISC: Status: AC

## 2014-12-17 NOTE — Telephone Encounter (Signed)
rx sent

## 2014-12-17 NOTE — Telephone Encounter (Signed)
Patient called stating that she would like a refill on her Rx   Rx: One Touch Delica lancets    Pharmacy: [facility] [address]    Thank you

## 2014-12-23 ENCOUNTER — Other Ambulatory Visit: Payer: Self-pay | Admitting: Endocrinology

## 2015-03-27 ENCOUNTER — Other Ambulatory Visit: Payer: Self-pay | Admitting: Endocrinology

## 2015-04-05 ENCOUNTER — Ambulatory Visit: Payer: 59 | Admitting: Endocrinology

## 2015-04-15 DIAGNOSIS — K219 Gastro-esophageal reflux disease without esophagitis: Secondary | ICD-10-CM | POA: Insufficient documentation

## 2015-04-19 ENCOUNTER — Ambulatory Visit: Payer: 59 | Admitting: Endocrinology

## 2015-05-06 ENCOUNTER — Other Ambulatory Visit: Payer: Self-pay | Admitting: Endocrinology

## 2015-05-30 IMAGING — MG MM DIGITAL SCREENING BILAT W/ CAD
1 series · 5 of 5 positions shown · non-contrast
Comparison: Previous exam(s)

CLINICAL DATA: Screening.

EXAM:
DIGITAL SCREENING BILATERAL MAMMOGRAM WITH CAD

[R CC · right · 5 of 5 slices shown]
[im 1/5]
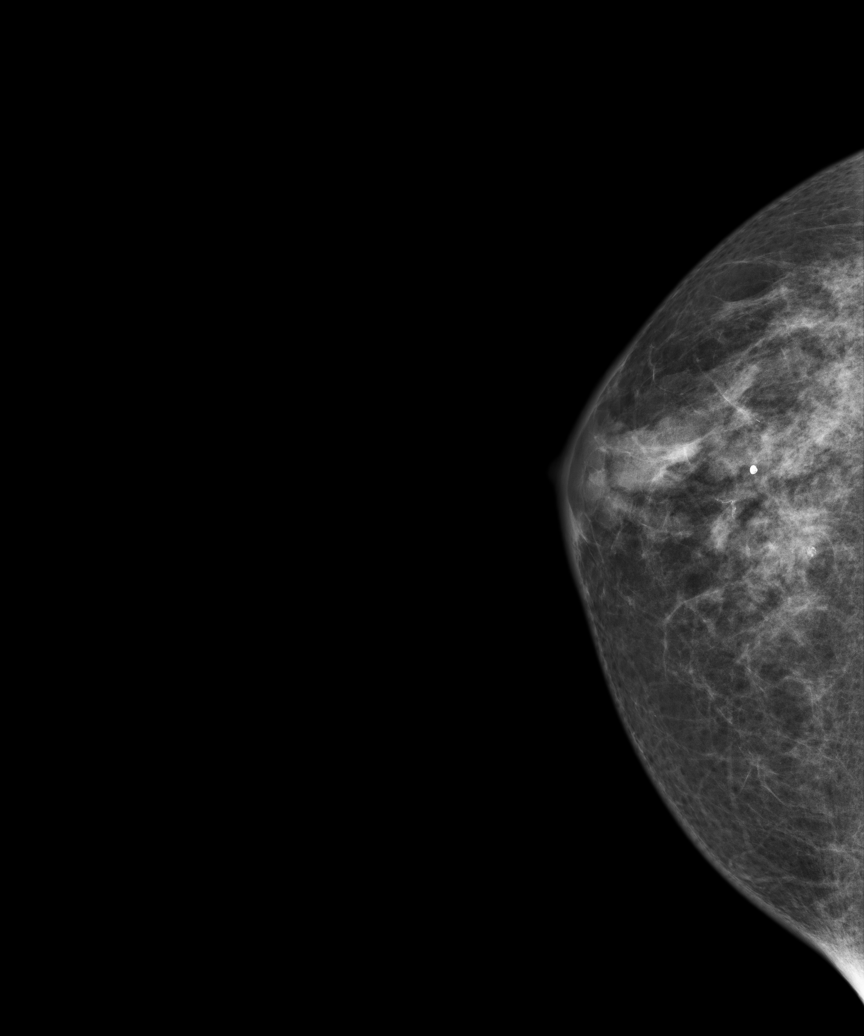
[im 2/5]
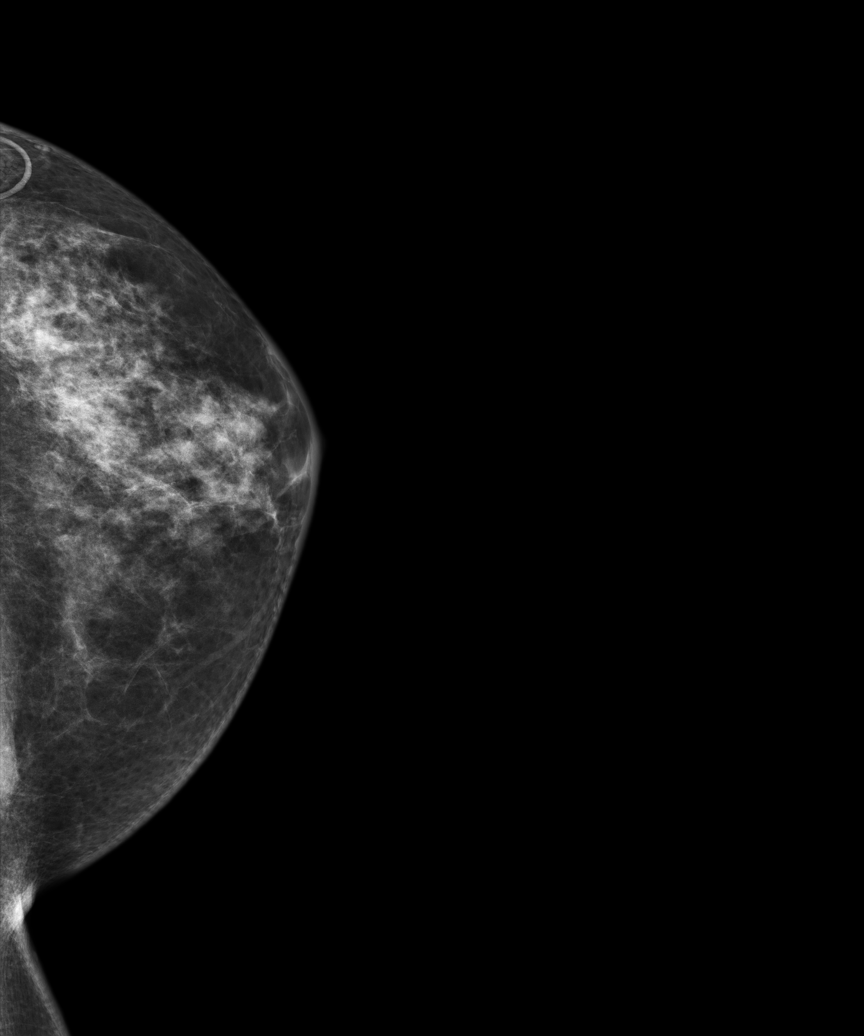
[im 3/5]
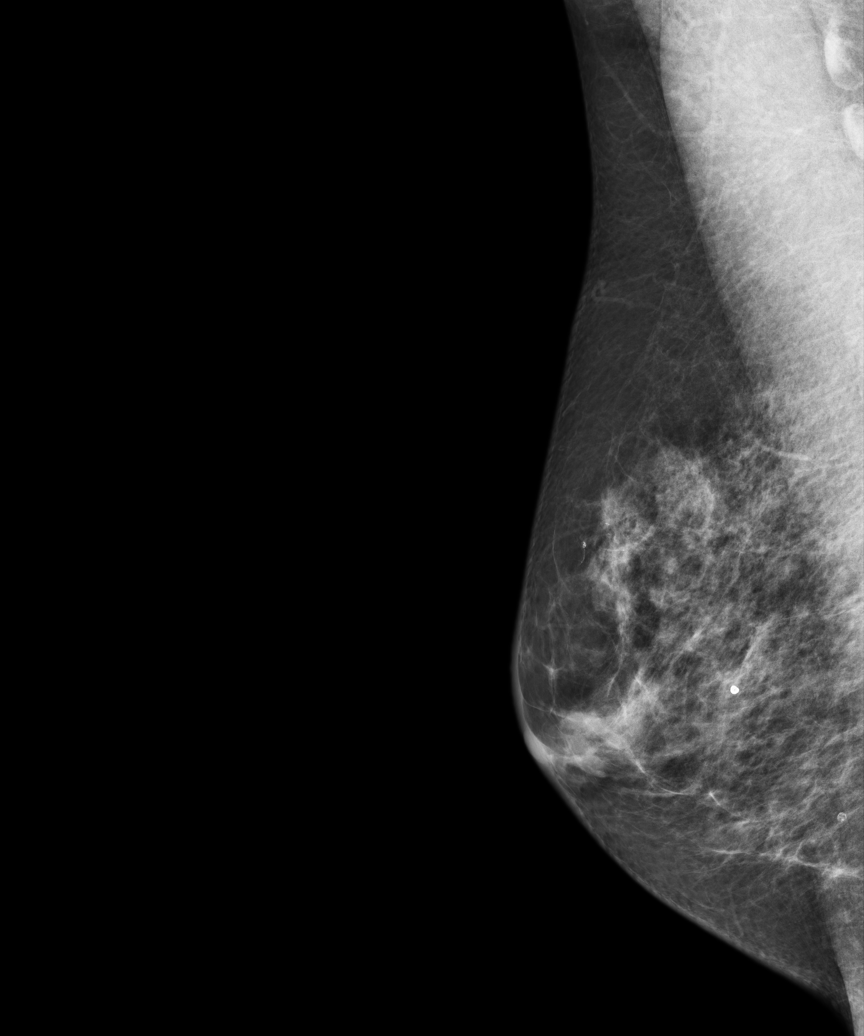
[im 4/5]
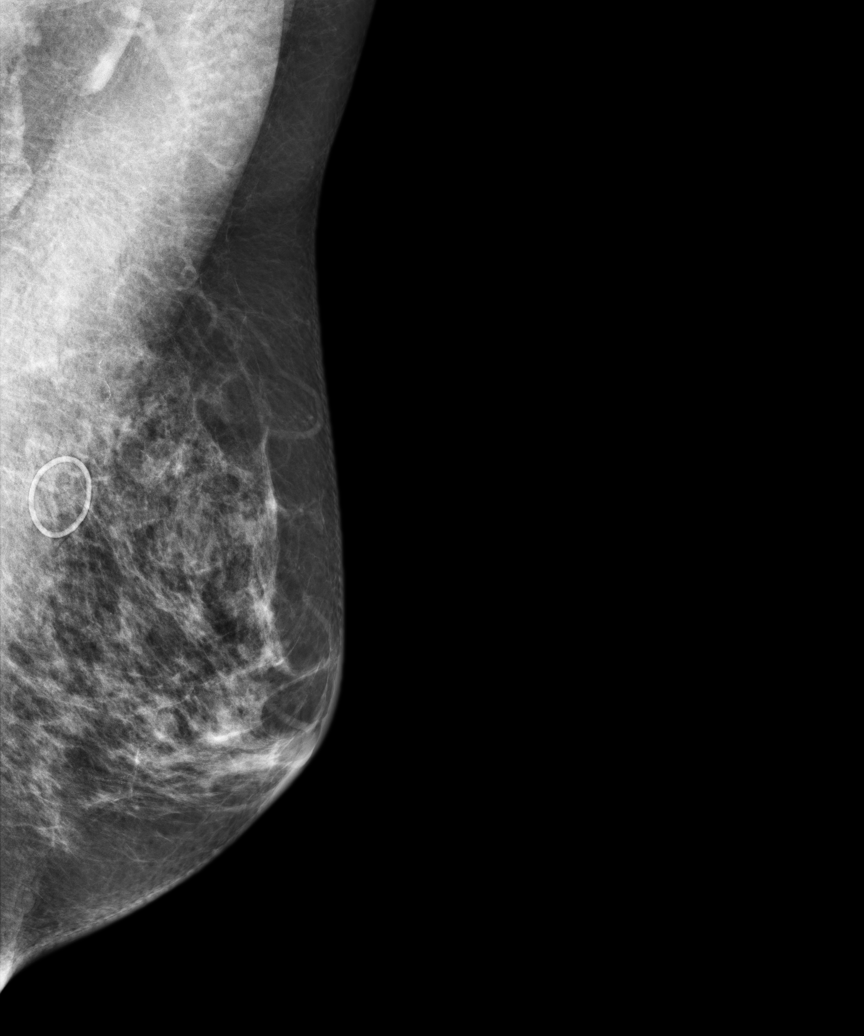
[im 5/5]
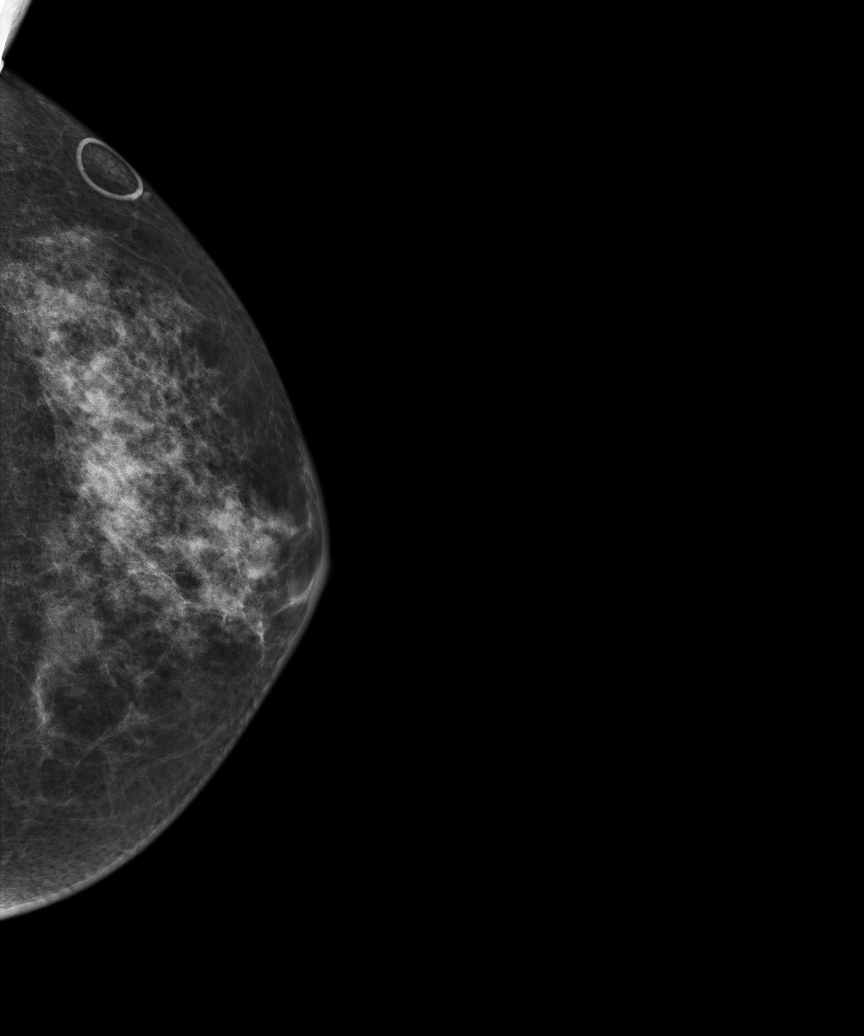

[5 of 5 positions shown; findings below may reference images not displayed]

ACR Breast Density Category c: The breast tissue is heterogeneously
dense, which may obscure small masses.
FINDINGS: In the right breast, a possible mass warrants further evaluation
with spot compression views and possibly ultrasound. In the left
breast, no findings suspicious for malignancy. Images were processed
with CAD.
IMPRESSION: Further evaluation is suggested for possible mass in the right
breast.

RECOMMENDATION:
Diagnostic mammogram and possibly ultrasound of the right breast.
(Code:[id number]-550)

The patient will be contacted regarding the findings, and additional
imaging will be scheduled.

BI-RADS CATEGORY  0: Incomplete. Need additional imaging evaluation
and/or prior mammograms for comparison.

## 2015-06-21 ENCOUNTER — Telehealth: Payer: Self-pay | Admitting: Endocrinology

## 2015-06-22 ENCOUNTER — Other Ambulatory Visit: Payer: Self-pay | Admitting: *Deleted

## 2015-06-22 ENCOUNTER — Telehealth: Payer: Self-pay | Admitting: Endocrinology

## 2015-06-22 MED ORDER — LOVASTATIN 20 MG PO TABS: ORAL_TABLET | ORAL | Status: DC

## 2015-06-22 NOTE — Telephone Encounter (Signed)
rx sent

## 2015-06-22 NOTE — Telephone Encounter (Signed)
PT needs refill on her Cholesterol medication sent to [facility] in [place] for 90 day supply

## 2015-06-22 NOTE — Telephone Encounter (Signed)
error 

## 2015-06-29 ENCOUNTER — Other Ambulatory Visit: Payer: Self-pay | Admitting: *Deleted

## 2015-06-29 ENCOUNTER — Ambulatory Visit (INDEPENDENT_AMBULATORY_CARE_PROVIDER_SITE_OTHER): Payer: 59 | Admitting: Endocrinology

## 2015-06-29 ENCOUNTER — Encounter: Payer: Self-pay | Admitting: Endocrinology

## 2015-06-29 VITALS — BP 112/74 | HR 67 | Temp 97.7°F | Resp 14 | Ht 66.0 in | Wt 205.2 lb

## 2015-06-29 DIAGNOSIS — E1169 Type 2 diabetes mellitus with other specified complication: Secondary | ICD-10-CM

## 2015-06-29 DIAGNOSIS — E1165 Type 2 diabetes mellitus with hyperglycemia: Secondary | ICD-10-CM | POA: Diagnosis not present

## 2015-06-29 DIAGNOSIS — IMO0002 Reserved for concepts with insufficient information to code with codable children: Secondary | ICD-10-CM

## 2015-06-29 DIAGNOSIS — E7800 Pure hypercholesterolemia, unspecified: Secondary | ICD-10-CM | POA: Diagnosis not present

## 2015-06-29 LAB — MICROALBUMIN / CREATININE URINE RATIO
Creatinine,U: 76.6 mg/dL
Microalb Creat Ratio: 0.9 mg/g (ref 0.0–30.0)

## 2015-06-29 LAB — POCT URINALYSIS DIPSTICK
Bilirubin, UA: NEGATIVE
Blood, UA: NEGATIVE
Ketones, UA: NEGATIVE
Leukocytes, UA: NEGATIVE
NITRITE UA: NEGATIVE
Protein, UA: NEGATIVE
SPEC GRAV UA: 1.015
Urobilinogen, UA: 0.2
pH, UA: 6

## 2015-06-29 LAB — LIPID PANEL
CHOL/HDL RATIO: 3
CHOLESTEROL: 154 mg/dL (ref 0–200)
HDL: 59.1 mg/dL (ref >39.00)
LDL CALC: 70 mg/dL (ref 0–99)
NonHDL: 94.81
Triglycerides: 126 mg/dL (ref 0.0–149.0)
VLDL: 25.2 mg/dL (ref 0.0–40.0)

## 2015-06-29 LAB — POCT GLYCOSYLATED HEMOGLOBIN (HGB A1C): HEMOGLOBIN A1C: 6.6

## 2015-06-29 LAB — COMPREHENSIVE METABOLIC PANEL
ALT: 13 U/L (ref 0–35)
AST: 14 U/L (ref 0–37)
Albumin: 4.2 g/dL (ref 3.5–5.2)
Alkaline Phosphatase: 84 U/L (ref 39–117)
BUN: 14 mg/dL (ref 6–23)
CALCIUM: 9.3 mg/dL (ref 8.4–10.5)
CHLORIDE: 103 meq/L (ref 96–112)
CO2: 27 meq/L (ref 19–32)
Creatinine, Ser: 0.7 mg/dL (ref 0.40–1.20)
GFR: 91.83 mL/min (ref >60.00)
GLUCOSE: 142 mg/dL — AB (ref 70–99)
Potassium: 4 mEq/L (ref 3.5–5.1)
SODIUM: 137 meq/L (ref 135–145)
Total Bilirubin: 2.1 mg/dL — ABNORMAL HIGH (ref 0.2–1.2)
Total Protein: 6.3 g/dL (ref 6.0–8.3)

## 2015-06-29 MED ORDER — CANAGLIFLOZIN-METFORMIN HCL 150-500 MG PO TABS: ORAL_TABLET | ORAL | Status: DC

## 2015-06-29 NOTE — Progress Notes (Signed)
Patient ID: Carrie Soto, female   DOB: [birth year], 55 y.o.   MRN: [id number]   Reason for Appointment: Diabetes follow-up   History of Present Illness   Diagnosis: Type 2 DIABETES MELITUS, date of diagnosis:  2007     Previous history: She was initially treated with Actos and glipizide but because of poor control she was changed to Actoplusmet, Amaryl and Onglyza With this her blood sugars have been reasonably well-controlled In 2014  she was given Invokana also mostly because of her difficulty with losing weight Blood blood sugar control was subsequently overall better and on her own she stopped Onglyza  Recent history:   She generally has fairly stable upper normal A1c readings with previous regimen of low-dose Actoplusmet along with Invokana On her last visit since A1c was 5.6 when she was wanting to stop her diabetes medicines she was told to leave off Actoplusmet but continue Invokana  Current management, blood sugar patterns and problems identified:  She has done well with improving her diet and losing 18 pounds since her last visit.  She is watching her portions and high-fat foods with using more stir fried vegetables  She has also been doing an exercise program at the gym with exercise bike and otherwise being active  She is however checking blood sugars only in the mornings and these are only slightly high; glucose  in the office today was 166 postprandially with low carbohydrate meal  Occasionally will take prednisone for her Mnire's disease but does not know if her sugars are higher with this  Did not bring her monitor for download  Her A1c is higher at 6.6, previously 5.6   Oral hypoglycemic drugs: 300 mg Invokana        Side effects from medications: None              Monitors blood glucose:  every other day or so on average recently .    Glucometer: One Touch.          Blood Glucose readings from recall:  Am 120-130   Physical activity: exercise: At the gym with bike 2/7 days a week and also doing other outside activities   Dietician visit: Most recent: 04/2009             Wt Readings from Last 3 Encounters:  06/29/15 205 lb 3.2 oz (93.078 kg)  12/04/14 223 lb (101.152 kg)  06/03/14 217 lb (98.431 kg)    LABS:     Lab Results  Component Value Date   HGBA1C 6.6 06/29/2015   HGBA1C 5.6 12/04/2014   HGBA1C 6.0 06/03/2014   Lab Results  Component Value Date   MICROALBUR 0.9 06/03/2014   LDLCALC 71 06/04/2014   CREATININE 0.68 12/04/2014    Office Visit on 06/29/2015  Component Date Value Ref Range Status  . Hemoglobin A1C 06/29/2015 6.6   Final      Medication List       This list is accurate as of: 06/29/15 12:21 PM.  Always use your most recent med list.               aspirin 81 MG tablet  Take 81 mg by mouth daily.     Canagliflozin-Metformin HCl 150-500 MG Tabs  Take 2 tablets daily     glucose blood test strip  Commonly known as:  ONE TOUCH ULTRA TEST  Use as instructed to check blood sugar 2 times per day Dx code E11.65  INVOKANA 300 MG Tabs tablet  Generic drug:  canagliflozin  TAKE 1 TABLET EVERY DAY     lovastatin 20 MG tablet  Commonly known as:  MEVACOR  TAKE ONE TABLET AT BEDTIME DAILY     ONE TOUCH DELICA LANCING DEV Misc  Use to check blood sugar 2 times per day dx code 123456     [facility] DELICA LANCETS FINE Misc  Use to check  blood sugar 2 times per day dx code E11.65     predniSONE 10 MG tablet  Commonly known as:  DELTASONE  Take 10 mg by mouth as needed.     promethazine 12.5 MG tablet  Commonly known as:  PHENERGAN  12.5 mg.     vitamin C 1000 MG tablet  Take 1,000 mg by mouth daily.     VITAMIN D PO  Take 600 Units by mouth.        Allergies:  Allergies  Allergen Reactions  . Clarithromycin Diarrhea    No past medical history on file.  No past surgical history on file.  Family History  Problem Relation Age of Onset  . Diabetes Neg Hx     Social History:  reports that she has never smoked. She has never used smokeless tobacco. Her alcohol and drug histories are not on file.  Review of Systems:  Eye exams normal, has been regular  Lipids: Has been treated with lovastatin 20 mg, Has not had follow-up with PCP about this   Lab Results  Component Value Date   CHOL 170 06/04/2014   HDL 65.80 06/04/2014   LDLCALC 71 06/04/2014   TRIG 164.0* 06/04/2014   CHOLHDL 3 06/04/2014      Meniere's disease: She has had less symptoms, still occasionally taking steroids      Examination:   BP 112/74 mmHg  Pulse 67  Temp(Src) 97.7 F (36.5 C)  Resp 14  Ht 5\' 6"  (1.676 m)  Wt 205 lb 3.2 oz (93.078 kg)  BMI 33.14 kg/m2  SpO2 97%  Body mass index is 33.14 kg/(m^2).    ASSESSMENT/ PLAN:   Diabetes type 2:   Blood glucose control is not as good with her stopping Actoplusmet last year, A1c higher than usual and she appears to have tendency to high postprandial readings Not taking Invokana alone This is despite her losing weight with regular exercise program and usually trying to watch her diet better  Since she had tolerated metformin previously will start this again and combined 1000 mg of extended release metformin with her Invokana She will come back in 3 months for follow-up Reminded her to check more readings after meals which she has not been doing and bring monitor for  download on each visit  Patient Instructions  Check blood sugars on waking up 2-3   times a week Also check blood sugars about 2 hours after a meal and do this after different meals by rotation  Recommended blood sugar levels on waking up is 90-130 and about 2 hours after meal is 130-160  Please bring your blood sugar monitor to each visit, thank you        [facility] 06/29/2015, 12:21 PM

## 2015-06-29 NOTE — Patient Instructions (Signed)
Check blood sugars on waking up 2-3 times a week Also check blood sugars about 2 hours after a meal and do this after different meals by rotation  Recommended blood sugar levels on waking up is 90-130 and about 2 hours after meal is 130-160  Please bring your blood sugar monitor to each visit, thank you  

## 2015-07-07 ENCOUNTER — Telehealth: Payer: Self-pay | Admitting: Endocrinology

## 2015-07-07 NOTE — Telephone Encounter (Signed)
Pt thinks the metformin or the combo of metformin and invokana is causing headaches and diarrhea

## 2015-07-08 NOTE — Telephone Encounter (Signed)
She can switch back to Invokana 300 mg daily along with metformin ER, 500 mg, 2 tablets daily

## 2015-07-08 NOTE — Telephone Encounter (Signed)
I contacted the pt and advised of note below. Pt stated she she paid 60$ for the mediciaton she is on and wanted to know if she could take 1 pill in the morning and the other in the evening to see if it helps with her headaches and diarrhea before changing her regimen again? Please advise, Thanks!

## 2015-07-08 NOTE — Telephone Encounter (Signed)
See note below and please advise, Thanks! 

## 2015-07-09 NOTE — Telephone Encounter (Signed)
She can take half of the Invokamet 150/500 in the morning and one tablet at dinner with food

## 2015-07-09 NOTE — Telephone Encounter (Signed)
I contacted the pt and advised of new instructions she voiced understanding.

## 2015-07-19 ENCOUNTER — Telehealth: Payer: Self-pay | Admitting: Endocrinology

## 2015-07-19 ENCOUNTER — Telehealth: Payer: Self-pay | Admitting: *Deleted

## 2015-07-19 MED ORDER — CANAGLIFLOZIN 300 MG PO TABS: 300.0000 mg | ORAL_TABLET | Freq: Every day | ORAL | Status: DC

## 2015-07-19 MED ORDER — PIOGLITAZONE HCL 15 MG PO TABS: 15.0000 mg | ORAL_TABLET | Freq: Every day | ORAL | Status: DC

## 2015-07-19 NOTE — Telephone Encounter (Signed)
PT also needs invokana refill sent into pharmacy

## 2015-07-19 NOTE — Telephone Encounter (Signed)
PT said that the Invokana and Metformin together is giving her diarrhea bad and that she needs to have something called in instead, she said she hasn't taken any medication today.

## 2015-07-19 NOTE — Telephone Encounter (Signed)
She can stop metformin, start pioglitazone 15 mg daily in addition to [employer]

## 2015-07-19 NOTE — Telephone Encounter (Signed)
Rxs sent

## 2015-07-19 NOTE — Telephone Encounter (Signed)
Please see below and advise.

## 2015-09-23 ENCOUNTER — Telehealth: Payer: Self-pay | Admitting: Endocrinology

## 2015-09-23 ENCOUNTER — Other Ambulatory Visit: Payer: Self-pay

## 2015-09-23 MED ORDER — LOVASTATIN 20 MG PO TABS: ORAL_TABLET | ORAL | Status: DC

## 2015-09-23 NOTE — Telephone Encounter (Signed)
[facility] [address].

## 2015-09-23 NOTE — Telephone Encounter (Signed)
Patient need a refill of lovastatin (MEVACOR) 20 MG tablet, [facility] to [facility] [address]

## 2015-09-23 NOTE — Telephone Encounter (Signed)
Rx submitted per pt's request.  

## 2015-09-23 NOTE — Telephone Encounter (Signed)
PT needs this medication resubmitted to [place] [phone]

## 2015-09-28 ENCOUNTER — Ambulatory Visit (INDEPENDENT_AMBULATORY_CARE_PROVIDER_SITE_OTHER): Payer: 59 | Admitting: Endocrinology

## 2015-09-28 ENCOUNTER — Encounter: Payer: Self-pay | Admitting: Endocrinology

## 2015-09-28 VITALS — BP 110/68 | HR 78 | Wt 201.0 lb

## 2015-09-28 DIAGNOSIS — E119 Type 2 diabetes mellitus without complications: Secondary | ICD-10-CM | POA: Diagnosis not present

## 2015-09-28 LAB — BASIC METABOLIC PANEL
BUN: 18 mg/dL (ref 6–23)
CHLORIDE: 105 meq/L (ref 96–112)
CO2: 28 mEq/L (ref 19–32)
Calcium: 9.5 mg/dL (ref 8.4–10.5)
Creatinine, Ser: 0.76 mg/dL (ref 0.40–1.20)
GFR: 83.44 mL/min (ref >60.00)
GLUCOSE: 184 mg/dL — AB (ref 70–99)
POTASSIUM: 4.1 meq/L (ref 3.5–5.1)
SODIUM: 139 meq/L (ref 135–145)

## 2015-09-28 LAB — POCT URINALYSIS DIPSTICK
BILIRUBIN UA: NEGATIVE
Blood, UA: NEGATIVE
GLUCOSE UA: 2000
Ketones, UA: NEGATIVE
LEUKOCYTES UA: NEGATIVE
NITRITE UA: NEGATIVE
Protein, UA: NEGATIVE
Spec Grav, UA: 1.01
UROBILINOGEN UA: 0.2
pH, UA: 5

## 2015-09-28 LAB — POCT GLYCOSYLATED HEMOGLOBIN (HGB A1C): HEMOGLOBIN A1C: 6

## 2015-09-28 MED ORDER — LOVASTATIN 20 MG PO TABS: ORAL_TABLET | ORAL | 1 refills | Status: DC

## 2015-09-28 NOTE — Patient Instructions (Addendum)
Check blood sugars on waking up 2  times a week Also check blood sugars about 2 hours after a meal and do this after different meals by rotation  Recommended blood sugar levels on waking up is 90-130 and about 2 hours after meal is 130-160  Please bring your blood sugar monitor to each visit, thank you  Get eye exam done

## 2015-09-28 NOTE — Progress Notes (Signed)
Patient ID: Carrie Soto, female   DOB: [birth year], 55 y.o.   MRN: [id number]   Reason for Appointment: Diabetes follow-up   History of Present Illness   Diagnosis: Type 2 DIABETES MELITUS, date of diagnosis:  2007     Previous history: She was initially treated with Actos and glipizide but because of poor control she was changed to Actoplusmet, Amaryl and Onglyza With this her blood sugars have been reasonably well-controlled In 2014  she was given Invokana also mostly because of her difficulty with losing weight Blood blood sugar control was subsequently overall better and on her own she stopped Onglyza  Recent history:   She generally has fairly stable upper normal A1c readings with previous regimen of low-dose Actoplusmet along with Invokana When her A1c was 5.6 she wanted to continue on Invokana alone However because of her A1c going up 1% she was started on Invokamet XR but she had complained of diarrhea with this Now on Invokana and Actos 15 mg daily  A1c is improved at 6.0 now  Current management, blood sugar patterns and problems identified:  She has continued doing fairly well with her diet and her weight is down another 4 pounds  She is generally very active recently with working at a house but also some formal exercise  Has not taken any prednisone recently for Mnire's disease  However has checked her blood  sugars sporadically with only 2 readings in the last month as below  Oral hypoglycemic drugs: 300 mg Invokana, Actos 15 mg daily        Side effects from medications: None              Monitors blood glucose:  every other day or so on average recently .    Glucometer: One Touch.          Blood Glucose readings from  monitor show recently readings of 121-137, did have relatively high readings in late April/early May   Physical activity: exercise: At the gym with bike 2/7 days a week and also doing other outside activities   Dietician visit: Most recent: 04/2009             Wt Readings from Last 3 Encounters:  09/28/15 201 lb (91.2 kg)  06/29/15 205 lb 3.2 oz (93.1 kg)  12/04/14 223 lb (101.2 kg)    LABS:     Lab Results  Component Value Date   HGBA1C 6.0 09/28/2015   HGBA1C 6.6 06/29/2015   HGBA1C 5.6 12/04/2014   Lab Results  Component Value Date   MICROALBUR <0.7 06/29/2015   [place] 70 06/29/2015   CREATININE 0.76 09/28/2015    Office Visit on 09/28/2015  Component Date Value Ref Range Status  . Color, UA 09/28/2015 yellow   Final  . Clarity, UA 09/28/2015 clear   Final  . Glucose, UA 09/28/2015 2000   Final  . Bilirubin, UA 09/28/2015 negative   Final  . Ketones, UA 09/28/2015 negative   Final  . Spec Grav, UA 09/28/2015 1.010   Final  . Blood, UA 09/28/2015 negative   Final  . pH, UA 09/28/2015 5.0   Final  . Protein, UA 09/28/2015 negative   Final  . Urobilinogen, UA 09/28/2015 0.2   Final  . Nitrite, UA 09/28/2015 negative   Final  . Leukocytes, UA 09/28/2015 Negative  Negative Final  . Sodium 09/28/2015 139  135 - 145 mEq/L Final  . Potassium 09/28/2015 4.1  3.5 - 5.1 mEq/L  Final  . Chloride 09/28/2015 105  96 - 112 mEq/L Final  . CO2 09/28/2015 28  19 - 32 mEq/L Final  . Glucose, Bld 09/28/2015 184* 70 - 99 mg/dL Final  . BUN 09/28/2015 18  6 - 23 mg/dL Final  . Creatinine, Ser 09/28/2015 0.76  0.40 - 1.20 mg/dL Final  . Calcium 09/28/2015 9.5  8.4 - 10.5  mg/dL Final  . GFR 09/28/2015 83.44  >60.00 mL/min Final  . Hemoglobin A1C 09/28/2015 6.0   Final      Medication List       Accurate as of 09/28/15  8:39 PM. Always use your most recent med list.          aspirin 81 MG tablet Take 81 mg by mouth daily.   canagliflozin 300 MG Tabs tablet Commonly known as:  INVOKANA Take 1 tablet (300 mg total) by mouth daily.   glucose blood test strip Commonly known as:  ONE TOUCH ULTRA TEST Use as instructed to check blood sugar 2 times per day Dx code E11.65   lovastatin 20 MG tablet Commonly known as:  MEVACOR TAKE ONE TABLET AT BEDTIME DAILY   ONE TOUCH DELICA LANCING DEV Misc Use to check blood sugar 2 times per day dx code 123456   [facility] DELICA LANCETS FINE Misc Use to check blood sugar 2 times per day dx code E11.65   pioglitazone 15 MG tablet Commonly known as:  ACTOS Take 1 tablet (15 mg total) by mouth daily.   predniSONE 10 MG tablet Commonly known as:  DELTASONE Take 10 mg by mouth as needed.   promethazine 12.5 MG tablet Commonly known as:  PHENERGAN 12.5 mg.   vitamin C 1000 MG tablet Take 1,000 mg by mouth daily.   VITAMIN D PO Take 600 Units by mouth.       Allergies:  Allergies  Allergen Reactions  . Clarithromycin Diarrhea    No past medical history on file.  No past surgical history on file.  Family History  Problem Relation Age of Onset  . Diabetes Neg Hx     Social History:  reports that she has never smoked. She has never used smokeless tobacco. Her alcohol and drug histories are not on file.  Review of Systems:  Eye exams normal, has been 2 years since her last exam  Lipids: Has been treated with lovastatin 20 mg, Last LDL 70   Lab Results  Component Value Date   CHOL 154 06/29/2015   HDL 59.10 06/29/2015   LDLCALC 70 06/29/2015   TRIG 126.0 06/29/2015   CHOLHDL 3 06/29/2015      Meniere's disease: She has had less Frequent symptoms      Examination:   BP 110/68  (BP Location: Left Arm, Patient Position: Sitting)   Pulse 78   Wt 201 lb (91.2 kg)   SpO2 97%   BMI 32.44 kg/m   Body mass index is 32.44 kg/m.    ASSESSMENT/ PLAN:   Diabetes type 2 with BMI 32:  See history of present illness for detailed discussion of current diabetes management, blood sugar patterns and problems identified She has done well with adding Actos to her Invokana with A1c down to 6. Previously was having tendency to higher postprandial readings More recently her blood sugars are better She is fairly active and losing weight However is checking blood sugars very sporadically  She will continue the same regimen, to check post prandial blood sugars more often  Encourage her  to schedule eye exam which is overdue  Patient Instructions  Check blood sugars on waking up 2  times a week Also check blood sugars about 2 hours after a meal and do this after different meals by rotation  Recommended blood sugar levels on waking up is 90-130 and about 2 hours after meal is 130-160  Please bring your blood sugar monitor to each visit, thank you  Get eye exam done      [facility] 09/28/2015, 8:39 PM

## 2015-10-04 ENCOUNTER — Other Ambulatory Visit: Payer: Self-pay

## 2015-10-14 ENCOUNTER — Other Ambulatory Visit: Payer: Self-pay | Admitting: Internal Medicine

## 2015-10-14 DIAGNOSIS — Z1231 Encounter for screening mammogram for malignant neoplasm of breast: Secondary | ICD-10-CM

## 2015-10-18 ENCOUNTER — Telehealth: Payer: Self-pay | Admitting: Endocrinology

## 2015-10-18 MED ORDER — PIOGLITAZONE HCL 15 MG PO TABS: 15.0000 mg | ORAL_TABLET | Freq: Every day | ORAL | 1 refills | Status: DC

## 2015-10-18 MED ORDER — CANAGLIFLOZIN 300 MG PO TABS: 300.0000 mg | ORAL_TABLET | Freq: Every day | ORAL | 1 refills | Status: DC

## 2015-10-18 NOTE — Telephone Encounter (Signed)
Pt needs actos 15 mg pioglitazone hcl 90 count invokana 300 mg 90 count [facility] [address]  Pt has no actos today

## 2015-10-18 NOTE — Telephone Encounter (Signed)
Rx submitted per pt's request.  

## 2015-10-18 NOTE — Telephone Encounter (Signed)
Patient need refill of medication tylenol 300 mg 90 count,  Tioglitazonehol 15 mg 90 [id number] [address], [place] [address] [phone] (Phone) [phone] (Fax)   Called [facility] 10/17/15 10:49 am

## 2015-10-19 ENCOUNTER — Other Ambulatory Visit: Payer: Self-pay | Admitting: Endocrinology

## 2015-10-25 ENCOUNTER — Ambulatory Visit
Admission: RE | Admit: 2015-10-25 | Discharge: 2015-10-25 | Disposition: A | Discharge status: Home / Self Care | Payer: Commercial Managed Care - HMO | Source: Ambulatory Visit | Attending: Internal Medicine | Admitting: Internal Medicine

## 2015-10-25 ENCOUNTER — Other Ambulatory Visit: Payer: Self-pay | Admitting: Internal Medicine

## 2015-10-25 DIAGNOSIS — Z1231 Encounter for screening mammogram for malignant neoplasm of breast: Secondary | ICD-10-CM

## 2015-10-25 HISTORY — DX: Malignant (primary) neoplasm, unspecified: C80.1

## 2015-12-28 ENCOUNTER — Other Ambulatory Visit: Payer: Self-pay | Admitting: Otolaryngology

## 2015-12-28 DIAGNOSIS — R070 Pain in throat: Secondary | ICD-10-CM

## 2015-12-28 DIAGNOSIS — R1310 Dysphagia, unspecified: Secondary | ICD-10-CM

## 2016-01-03 ENCOUNTER — Ambulatory Visit: Payer: Commercial Managed Care - HMO | Attending: Otolaryngology

## 2016-01-26 ENCOUNTER — Ambulatory Visit: Payer: Commercial Managed Care - HMO | Admitting: Endocrinology

## 2016-01-28 ENCOUNTER — Ambulatory Visit: Payer: Commercial Managed Care - HMO | Admitting: Endocrinology

## 2016-04-20 DIAGNOSIS — E119 Type 2 diabetes mellitus without complications: Secondary | ICD-10-CM | POA: Diagnosis not present

## 2016-04-20 DIAGNOSIS — Z0000 Encounter for general adult medical examination without abnormal findings: Secondary | ICD-10-CM | POA: Diagnosis not present

## 2016-04-20 DIAGNOSIS — E7800 Pure hypercholesterolemia, unspecified: Secondary | ICD-10-CM | POA: Diagnosis not present

## 2016-05-11 ENCOUNTER — Telehealth: Payer: Self-pay | Admitting: Endocrinology

## 2016-05-12 NOTE — Telephone Encounter (Signed)
Please give her only 30 with no refill, let her know that she needs to make an appointment, same day labs

## 2016-05-12 NOTE — Telephone Encounter (Signed)
Last office visit 09/28/15 no future scheduled ok to refill?

## 2016-05-15 NOTE — Telephone Encounter (Signed)
This has been ordered 

## 2016-10-03 DIAGNOSIS — E7800 Pure hypercholesterolemia, unspecified: Secondary | ICD-10-CM | POA: Diagnosis not present

## 2016-10-03 DIAGNOSIS — Z0000 Encounter for general adult medical examination without abnormal findings: Secondary | ICD-10-CM | POA: Diagnosis not present

## 2016-10-03 DIAGNOSIS — E119 Type 2 diabetes mellitus without complications: Secondary | ICD-10-CM | POA: Diagnosis not present

## 2016-10-10 ENCOUNTER — Other Ambulatory Visit: Payer: Self-pay | Admitting: Internal Medicine

## 2016-10-10 DIAGNOSIS — M7989 Other specified soft tissue disorders: Secondary | ICD-10-CM | POA: Diagnosis not present

## 2016-10-10 DIAGNOSIS — M79605 Pain in left leg: Secondary | ICD-10-CM

## 2016-10-10 DIAGNOSIS — E119 Type 2 diabetes mellitus without complications: Secondary | ICD-10-CM | POA: Diagnosis not present

## 2016-10-10 DIAGNOSIS — R609 Edema, unspecified: Secondary | ICD-10-CM

## 2016-10-10 DIAGNOSIS — L298 Other pruritus: Secondary | ICD-10-CM | POA: Diagnosis not present

## 2016-10-11 ENCOUNTER — Ambulatory Visit
Admission: RE | Admit: 2016-10-11 | Discharge: 2016-10-11 | Disposition: A | Discharge status: Home / Self Care | Payer: 59 | Source: Ambulatory Visit | Attending: Internal Medicine | Admitting: Internal Medicine

## 2016-10-11 DIAGNOSIS — L298 Other pruritus: Secondary | ICD-10-CM | POA: Diagnosis not present

## 2016-10-11 DIAGNOSIS — M79605 Pain in left leg: Secondary | ICD-10-CM

## 2016-10-11 DIAGNOSIS — M7989 Other specified soft tissue disorders: Secondary | ICD-10-CM | POA: Insufficient documentation

## 2016-10-11 DIAGNOSIS — R609 Edema, unspecified: Secondary | ICD-10-CM

## 2016-12-06 DIAGNOSIS — J40 Bronchitis, not specified as acute or chronic: Secondary | ICD-10-CM | POA: Diagnosis not present

## 2016-12-06 DIAGNOSIS — E119 Type 2 diabetes mellitus without complications: Secondary | ICD-10-CM | POA: Diagnosis not present

## 2017-02-20 DIAGNOSIS — L821 Other seborrheic keratosis: Secondary | ICD-10-CM | POA: Diagnosis not present

## 2017-02-20 DIAGNOSIS — L820 Inflamed seborrheic keratosis: Secondary | ICD-10-CM | POA: Diagnosis not present

## 2017-02-20 DIAGNOSIS — L2489 Irritant contact dermatitis due to other agents: Secondary | ICD-10-CM | POA: Diagnosis not present

## 2017-02-20 DIAGNOSIS — D1800 Hemangioma unspecified site: Secondary | ICD-10-CM | POA: Diagnosis not present

## 2017-07-03 DIAGNOSIS — L298 Other pruritus: Secondary | ICD-10-CM | POA: Diagnosis not present

## 2017-07-03 DIAGNOSIS — E119 Type 2 diabetes mellitus without complications: Secondary | ICD-10-CM | POA: Diagnosis not present

## 2017-07-03 DIAGNOSIS — M7989 Other specified soft tissue disorders: Secondary | ICD-10-CM | POA: Diagnosis not present

## 2017-07-10 DIAGNOSIS — E1165 Type 2 diabetes mellitus with hyperglycemia: Secondary | ICD-10-CM | POA: Diagnosis not present

## 2017-07-10 DIAGNOSIS — M25562 Pain in left knee: Secondary | ICD-10-CM | POA: Diagnosis not present

## 2017-07-10 DIAGNOSIS — Z0000 Encounter for general adult medical examination without abnormal findings: Secondary | ICD-10-CM | POA: Diagnosis not present

## 2017-07-19 ENCOUNTER — Other Ambulatory Visit: Payer: Self-pay | Admitting: Internal Medicine

## 2017-07-19 DIAGNOSIS — Z1231 Encounter for screening mammogram for malignant neoplasm of breast: Secondary | ICD-10-CM

## 2017-08-20 ENCOUNTER — Ambulatory Visit
Admission: RE | Admit: 2017-08-20 | Discharge: 2017-08-20 | Disposition: A | Discharge status: Home / Self Care | Payer: 59 | Source: Ambulatory Visit | Attending: Internal Medicine | Admitting: Internal Medicine

## 2017-08-20 DIAGNOSIS — Z1231 Encounter for screening mammogram for malignant neoplasm of breast: Secondary | ICD-10-CM | POA: Insufficient documentation

## 2017-11-07 DIAGNOSIS — Z0000 Encounter for general adult medical examination without abnormal findings: Secondary | ICD-10-CM | POA: Diagnosis not present

## 2017-11-07 DIAGNOSIS — E7800 Pure hypercholesterolemia, unspecified: Secondary | ICD-10-CM | POA: Diagnosis not present

## 2017-11-07 DIAGNOSIS — M25562 Pain in left knee: Secondary | ICD-10-CM | POA: Diagnosis not present

## 2017-11-12 DIAGNOSIS — L298 Other pruritus: Secondary | ICD-10-CM | POA: Diagnosis not present

## 2017-11-12 DIAGNOSIS — E119 Type 2 diabetes mellitus without complications: Secondary | ICD-10-CM | POA: Diagnosis not present

## 2017-11-12 DIAGNOSIS — K219 Gastro-esophageal reflux disease without esophagitis: Secondary | ICD-10-CM | POA: Diagnosis not present

## 2017-12-07 DIAGNOSIS — Z23 Encounter for immunization: Secondary | ICD-10-CM | POA: Diagnosis not present

## 2018-03-08 DIAGNOSIS — L298 Other pruritus: Secondary | ICD-10-CM | POA: Diagnosis not present

## 2018-03-08 DIAGNOSIS — K219 Gastro-esophageal reflux disease without esophagitis: Secondary | ICD-10-CM | POA: Diagnosis not present

## 2018-03-08 DIAGNOSIS — E119 Type 2 diabetes mellitus without complications: Secondary | ICD-10-CM | POA: Diagnosis not present

## 2018-10-14 ENCOUNTER — Other Ambulatory Visit: Payer: Self-pay | Admitting: Internal Medicine

## 2018-10-14 DIAGNOSIS — Z1231 Encounter for screening mammogram for malignant neoplasm of breast: Secondary | ICD-10-CM

## 2019-09-02 ENCOUNTER — Other Ambulatory Visit: Payer: Self-pay

## 2019-09-02 ENCOUNTER — Encounter: Payer: Self-pay | Admitting: Dermatology

## 2019-09-02 ENCOUNTER — Ambulatory Visit: Payer: 59 | Admitting: Dermatology

## 2019-09-02 DIAGNOSIS — C4441 Basal cell carcinoma of skin of scalp and neck: Secondary | ICD-10-CM

## 2019-09-02 DIAGNOSIS — L820 Inflamed seborrheic keratosis: Secondary | ICD-10-CM

## 2019-09-02 DIAGNOSIS — D485 Neoplasm of uncertain behavior of skin: Secondary | ICD-10-CM

## 2019-09-02 DIAGNOSIS — L0100 Impetigo, unspecified: Secondary | ICD-10-CM | POA: Diagnosis not present

## 2019-09-02 DIAGNOSIS — L821 Other seborrheic keratosis: Secondary | ICD-10-CM

## 2019-09-02 DIAGNOSIS — C4491 Basal cell carcinoma of skin, unspecified: Secondary | ICD-10-CM

## 2019-09-02 DIAGNOSIS — L578 Other skin changes due to chronic exposure to nonionizing radiation: Secondary | ICD-10-CM

## 2019-09-02 DIAGNOSIS — Z85828 Personal history of other malignant neoplasm of skin: Secondary | ICD-10-CM

## 2019-09-02 HISTORY — DX: Basal cell carcinoma of skin, unspecified: C44.91

## 2019-09-02 MED ORDER — MUPIROCIN 2 % EX OINT: TOPICAL_OINTMENT | CUTANEOUS | 0 refills | Status: AC

## 2019-09-02 NOTE — Progress Notes (Signed)
Follow-Up Visit   Subjective  Carrie Soto is a 59 y.o. female who presents for the following: spot (L nostril x 4 mos, patient would pick at and clear fluid would come out, not as prominent now), itchy spot (L side at braline, back), and moles (right post ear x yrs, growing). Patient has a history of BCC of the right infranasal treated in 2009.   The following portions of the chart were reviewed this encounter and updated as appropriate:      Review of Systems:  No other skin or systemic complaints except as noted in HPI or Assessment and Plan.  Objective  Well appearing patient in no apparent distress; mood and affect are within normal limits.  A focused examination was performed including face, back, flank. Relevant physical exam findings are noted in the Assessment and Plan.  Objective  Left Nostril: Clear today; pt reports getting scabs that comes and goes.  Objective  Right Postauricular: 5.48mm pink pearly papule R post auricular  Objective  Right Abdomen: 10.74mm violaceous papule R abd  Objective  Left Flank: Erythematous keratotic or waxy stuck-on papule   Assessment & Plan   Actinic Damage - diffuse scaly erythematous macules with underlying dyspigmentation - Recommend daily broad spectrum sunscreen SPF 30+ to sun-exposed areas, reapply every 2 hours as needed.  - Call for new or changing lesions.  History of Basal Cell Carcinoma of the Skin - No evidence of recurrence today - Recommend regular full body skin exams - Recommend daily broad spectrum sunscreen SPF 30+ to sun-exposed areas, reapply every 2 hours as needed.  - Call if any new or changing lesions are noted between office visits   Seborrheic Keratoses - Stuck-on, waxy, tan-brown papules and plaques  - Discussed benign etiology and prognosis. - Observe - Call for any changes  Impetigo Left Nostril  Start mupirocin 2% ointment Apply inside nose BID x 1 week.   mupirocin ointment  (BACTROBAN) 2 % - Left Nostril  Neoplasm of uncertain behavior of skin (2) Right Postauricular  Skin / nail biopsy Type of biopsy: tangential   Informed consent: discussed and consent obtained   Patient was prepped and draped in usual sterile fashion: Area prepped with alcohol. Anesthesia: the lesion was anesthetized in a standard fashion   Anesthetic:  1% lidocaine w/ epinephrine 1-100,000 buffered w/ 8.4% NaHCO3 Instrument used: flexible razor blade   Hemostasis achieved with: pressure, aluminum chloride and electrodesiccation   Outcome: patient tolerated procedure well   Post-procedure details: wound care instructions given   Post-procedure details comment:  Ointment and small bandage applied  Destruction of lesion  Destruction method: electrodesiccation and curettage   Informed consent: discussed and consent obtained   Timeout:  patient name, date of birth, surgical site, and procedure verified Curettage performed in three different directions: Yes   Electrodesiccation performed over the curetted area: Yes   Lesion length (cm):  0.5 Lesion width (cm):  0.5 Margin per side (cm):  0.1 Final wound size (cm):  0.7 Hemostasis achieved with:  pressure, aluminum chloride and electrodesiccation Outcome: patient tolerated procedure well with no complications   Post-procedure details: wound care instructions given    Specimen 1 - Surgical pathology Differential Diagnosis: Hemangioma r/o BCC Check Margins: No 5.68mm pink pearly papule EDC today  Right Abdomen  Skin / nail biopsy Type of biopsy: tangential   Informed consent: discussed and consent obtained   Patient was prepped and draped in usual sterile fashion: Area prepped with alcohol.  Anesthesia: the lesion was anesthetized in a standard fashion   Anesthetic:  1% lidocaine w/ epinephrine 1-100,000 buffered w/ 8.4% NaHCO3 Instrument used: flexible razor blade   Hemostasis achieved with: pressure, aluminum chloride and  electrodesiccation   Outcome: patient tolerated procedure well   Post-procedure details: wound care instructions given   Post-procedure details comment:  Ointment and small bandage applied  Specimen 2 - Surgical pathology Differential Diagnosis: Irritated Hemangioma vs other Check Margins: No 10.65mm violaceous papule  Inflamed seborrheic keratosis Left Flank  Destruction of lesion - Left Flank  Destruction method: cryotherapy   Informed consent: discussed and consent obtained   Lesion destroyed using liquid nitrogen: Yes   Region frozen until ice ball extended beyond lesion: Yes   Outcome: patient tolerated procedure well with no complications   Post-procedure details: wound care instructions given    Basal cell carcinoma (BCC) of skin of neck  Return in 6 months (on 03/03/2020).  IJamesetta Orleans, CMA, am acting as scribe for Brendolyn Patty, MD .  Documentation: I have reviewed the above documentation for accuracy and completeness, and I agree with the above.  Brendolyn Patty MD

## 2019-09-02 NOTE — Patient Instructions (Addendum)
Wound Care Instructions ° °1. Cleanse wound gently with soap and water once a day then pat dry with clean gauze. Apply a thing coat of Petrolatum (petroleum jelly, "Vaseline") over the wound (unless you have an allergy to this). We recommend that you use a new, sterile tube of Vaseline. Do not pick or remove scabs. Do not remove the yellow or white "healing tissue" from the base of the wound. ° °2. Cover the wound with fresh, clean, nonstick gauze and secure with paper tape. You may use Band-Aids in place of gauze and tape if the would is small enough, but would recommend trimming much of the tape off as there is often too much. Sometimes Band-Aids can irritate the skin. ° °3. You should call the office for your biopsy report after 1 week if you have not already been contacted. ° °4. If you experience any problems, such as abnormal amounts of bleeding, swelling, significant bruising, significant pain, or evidence of infection, please call the office immediately. ° °Cryotherapy Aftercare ° °• Wash gently with soap and water everyday.   °• Apply Vaseline and Band-Aid daily until healed. ° ° °

## 2019-09-08 ENCOUNTER — Telehealth: Payer: Self-pay

## 2019-09-08 NOTE — Telephone Encounter (Signed)
-----   Message from Brendolyn Patty, MD sent at 09/08/2019  9:12 AM EDT ----- 1. Skin , right postauricular BASAL CELL CARCINOMA, NODULAR PATTERN 2. Skin , right abdomen HEMANGIOMA  1. BCC, already treated with EDC 2. benign

## 2019-09-08 NOTE — Telephone Encounter (Signed)
Advised patient of biopsy results. Patient scheduled for 6 month follow-up 02/23/20 at 9:30am.

## 2019-10-13 ENCOUNTER — Other Ambulatory Visit: Payer: Self-pay | Admitting: Internal Medicine

## 2019-10-13 DIAGNOSIS — Z1231 Encounter for screening mammogram for malignant neoplasm of breast: Secondary | ICD-10-CM

## 2019-10-27 ENCOUNTER — Other Ambulatory Visit: Payer: Self-pay

## 2019-10-27 DIAGNOSIS — Z1152 Encounter for screening for COVID-19: Secondary | ICD-10-CM

## 2019-10-27 NOTE — Progress Notes (Signed)
Covid Vaccine - No  Pregnant - No  S/Sx - No  Potential Exposure - Exposed to a woman who tested positive - Encounter involved the woman handing her a card. Encounter for only 5-6 minutes & within 6 ft. Neither were wearing a mask.  AMD

## 2019-10-29 LAB — NOVEL CORONAVIRUS, NAA: SARS-CoV-2, NAA: NOT DETECTED

## 2019-10-29 LAB — SARS-COV-2, NAA 2 DAY TAT

## 2020-02-23 ENCOUNTER — Ambulatory Visit: Payer: 59 | Admitting: Dermatology

## 2020-02-23 ENCOUNTER — Other Ambulatory Visit: Payer: Self-pay

## 2020-02-23 ENCOUNTER — Encounter: Payer: Self-pay | Admitting: Dermatology

## 2020-02-23 DIAGNOSIS — L820 Inflamed seborrheic keratosis: Secondary | ICD-10-CM

## 2020-02-23 DIAGNOSIS — D1800 Hemangioma unspecified site: Secondary | ICD-10-CM | POA: Diagnosis not present

## 2020-02-23 DIAGNOSIS — Z85828 Personal history of other malignant neoplasm of skin: Secondary | ICD-10-CM

## 2020-02-23 DIAGNOSIS — L578 Other skin changes due to chronic exposure to nonionizing radiation: Secondary | ICD-10-CM

## 2020-02-23 DIAGNOSIS — L570 Actinic keratosis: Secondary | ICD-10-CM

## 2020-02-23 DIAGNOSIS — D692 Other nonthrombocytopenic purpura: Secondary | ICD-10-CM

## 2020-02-23 NOTE — Progress Notes (Signed)
   Follow-Up Visit   Subjective  Carrie Soto is a 59 y.o. female who presents for the following: Follow-up (Hx of BCC of the right postauricular, treated with EDC 6 months ago. She also has itchy spots on her back and spot on right hand.).   The following portions of the chart were reviewed this encounter and updated as appropriate:       Review of Systems:  No other skin or systemic complaints except as noted in HPI or Assessment and Plan.  Objective  Well appearing patient in no apparent distress; mood and affect are within normal limits.  A focused examination was performed including face, back, hand. Relevant physical exam findings are noted in the Assessment and Plan.  Objective  Right Postauricular: Well healed scar with no evidence of recurrence.   Objective  Right thenar hand dorsum x 1: Keratotic papule.  Objective  L flank at braline x 2, mid back x 7 (9): Erythematous keratotic or waxy stuck-on papule    Assessment & Plan   Actinic Damage - chronic, secondary to cumulative UV radiation exposure/sun exposure over time - diffuse scaly erythematous macules with underlying dyspigmentation - Recommend daily broad spectrum sunscreen SPF 30+ to sun-exposed areas, reapply every 2 hours as needed.  - Call for new or changing lesions.  Hemangiomas - Red papules - Discussed benign nature - Observe - Call for any changes  Purpura - Chronic; persistent and recurrent.  Treatable, but not curable. - Violaceous macules and patches - Benign - Related to age, sun damage and/or use of blood thinners.  Pt has h/o of oral prednisone use off and on for Meniere's disease. - Observe - Can use OTC arnica containing moisturizer such as Dermend Bruise Formula if desired - Call for worsening or other concerns   History of basal cell carcinoma (BCC) Right Postauricular  Clear. Observe for recurrence. Call clinic for new or changing lesions.  Recommend regular skin exams,  daily broad-spectrum spf 30+ sunscreen use, and photoprotection.     Hypertrophic actinic keratosis Right thenar hand dorsum x 1  Destruction of lesion - Right thenar hand dorsum x 1  Destruction method: cryotherapy   Informed consent: discussed and consent obtained   Lesion destroyed using liquid nitrogen: Yes   Region frozen until ice ball extended beyond lesion: Yes   Outcome: patient tolerated procedure well with no complications   Post-procedure details: wound care instructions given    Inflamed seborrheic keratosis (9) L flank at braline x 2, mid back x 7  Destruction of lesion - L flank at braline x 2, mid back x 7  Destruction method: cryotherapy   Informed consent: discussed and consent obtained   Lesion destroyed using liquid nitrogen: Yes   Region frozen until ice ball extended beyond lesion: Yes   Outcome: patient tolerated procedure well with no complications   Post-procedure details: wound care instructions given    Return in about 6 months (around 08/23/2020) for TBSE.   IJamesetta Orleans, CMA, am acting as scribe for Brendolyn Patty, MD .  Documentation: I have reviewed the above documentation for accuracy and completeness, and I agree with the above.  Brendolyn Patty MD

## 2020-02-23 NOTE — Patient Instructions (Addendum)

## 2020-05-11 ENCOUNTER — Ambulatory Visit
Admission: RE | Admit: 2020-05-11 | Discharge: 2020-05-11 | Disposition: A | Discharge status: Home / Self Care | Payer: 59 | Source: Ambulatory Visit | Attending: Internal Medicine | Admitting: Internal Medicine

## 2020-05-11 ENCOUNTER — Other Ambulatory Visit: Payer: Self-pay

## 2020-05-11 DIAGNOSIS — Z1231 Encounter for screening mammogram for malignant neoplasm of breast: Secondary | ICD-10-CM | POA: Insufficient documentation

## 2020-08-16 ENCOUNTER — Ambulatory Visit: Payer: 59 | Admitting: Dermatology

## 2020-12-27 ENCOUNTER — Ambulatory Visit: Payer: 59 | Admitting: Dermatology

## 2020-12-27 ENCOUNTER — Other Ambulatory Visit: Payer: Self-pay

## 2020-12-27 DIAGNOSIS — L820 Inflamed seborrheic keratosis: Secondary | ICD-10-CM

## 2020-12-27 DIAGNOSIS — D1801 Hemangioma of skin and subcutaneous tissue: Secondary | ICD-10-CM

## 2020-12-27 DIAGNOSIS — L578 Other skin changes due to chronic exposure to nonionizing radiation: Secondary | ICD-10-CM | POA: Diagnosis not present

## 2020-12-27 DIAGNOSIS — C44311 Basal cell carcinoma of skin of nose: Secondary | ICD-10-CM

## 2020-12-27 DIAGNOSIS — Z85828 Personal history of other malignant neoplasm of skin: Secondary | ICD-10-CM | POA: Diagnosis not present

## 2020-12-27 DIAGNOSIS — D239 Other benign neoplasm of skin, unspecified: Secondary | ICD-10-CM

## 2020-12-27 DIAGNOSIS — Z1283 Encounter for screening for malignant neoplasm of skin: Secondary | ICD-10-CM | POA: Diagnosis not present

## 2020-12-27 DIAGNOSIS — L570 Actinic keratosis: Secondary | ICD-10-CM

## 2020-12-27 DIAGNOSIS — D229 Melanocytic nevi, unspecified: Secondary | ICD-10-CM

## 2020-12-27 DIAGNOSIS — L821 Other seborrheic keratosis: Secondary | ICD-10-CM

## 2020-12-27 DIAGNOSIS — D492 Neoplasm of unspecified behavior of bone, soft tissue, and skin: Secondary | ICD-10-CM

## 2020-12-27 DIAGNOSIS — Z872 Personal history of diseases of the skin and subcutaneous tissue: Secondary | ICD-10-CM | POA: Diagnosis not present

## 2020-12-27 DIAGNOSIS — L720 Epidermal cyst: Secondary | ICD-10-CM

## 2020-12-27 DIAGNOSIS — C4491 Basal cell carcinoma of skin, unspecified: Secondary | ICD-10-CM

## 2020-12-27 DIAGNOSIS — L814 Other melanin hyperpigmentation: Secondary | ICD-10-CM

## 2020-12-27 HISTORY — DX: Basal cell carcinoma of skin, unspecified: C44.91

## 2020-12-27 NOTE — Patient Instructions (Addendum)
If you have any questions or concerns for your doctor, please call our main line at [phone] and press option 4 to reach your doctor's medical assistant. If no one answers, please leave a voicemail as directed and we will return your call as soon as possible. Messages left after 4 pm will be answered the following business day.   You may also send us a message via MyChart. We typically respond to MyChart messages within 1-2 business days.  For prescription refills, please ask your pharmacy to contact our office. Our fax number is [phone].  If you have an urgent issue when the clinic is closed that cannot wait until the next business day, you can page your doctor at the number below.    Please note that while we do our best to be available for urgent issues outside of office hours, we are not available 24/7.   If you have an urgent issue and are unable to reach us, you may choose to seek medical care at your doctor's office, retail clinic, urgent care center, or emergency room.  If you have a medical emergency, please immediately call 911 or go to the emergency department.  Pager Numbers  - Dr. Kowalski: [phone]  - Dr. Moye: [phone]  - Dr. Stewart: [phone]  In the event of inclement weather, please call our main line at [phone] for an update on the status of any delays or closures.  Dermatology Medication Tips: Please keep the boxes that topical medications come in in order to help keep track of the instructions about where and how to use these. Pharmacies typically print the medication instructions only on the boxes and not directly on the medication tubes.   If your medication is too expensive, please contact our office at [phone] option 4 or send us a message through MyChart.   We are unable to tell what your co-pay for medications will be in advance as this is different depending on your insurance coverage. However, we may be able to find a substitute  medication at lower cost or fill out paperwork to get insurance to cover a needed medication.   If a prior authorization is required to get your medication covered by your insurance company, please allow us 1-2 business days to complete this process.  Drug prices often vary depending on where the prescription is filled and some pharmacies may offer cheaper prices.  The website www.goodrx.com contains coupons for medications through different pharmacies. The prices here do not account for what the cost may be with help from insurance (it may be cheaper with your insurance), but the website can give you the price if you did not use any insurance.  - You can print the associated coupon and take it with your prescription to the pharmacy.  - You may also stop by our office during regular business hours and pick up a GoodRx coupon card.  - If you need your prescription sent electronically to a different pharmacy, notify our office through [facility] MyChart or by phone at [phone] option 4.   Wound Care Instructions  Cleanse wound gently with soap and water once a day then pat dry with clean gauze. Apply a thing coat of Petrolatum (petroleum jelly, "Vaseline") over the wound (unless you have an allergy to this). We recommend that you use a new, sterile tube of Vaseline. Do not pick or remove scabs. Do not remove the yellow or white "healing tissue" from the base of the wound.  Cover the   wound with fresh, clean, nonstick gauze and secure with paper tape. You may use Band-Aids in place of gauze and tape if the would is small enough, but would recommend trimming much of the tape off as there is often too much. Sometimes Band-Aids can irritate the skin.  You should call the office for your biopsy report after 1 week if you have not already been contacted.  If you experience any problems, such as abnormal amounts of bleeding, swelling, significant bruising, significant pain, or evidence of infection,  please call the office immediately.  FOR ADULT SURGERY PATIENTS: If you need something for pain relief you may take 1 extra strength Tylenol (acetaminophen) AND 2 Ibuprofen (200mg  each) together every 4 hours as needed for pain. (do not take these if you are allergic to them or if you have a reason you should not take them.) Typically, you may only need pain medication for 1 to 3 days.    Cryotherapy Aftercare  Wash gently with soap and water everyday.   Apply Vaseline and Band-Aid daily until healed.

## 2020-12-27 NOTE — Progress Notes (Signed)
Follow-Up Visit   Subjective  Carrie Soto is a 60 y.o. female who presents for the following: Total body skin exam (Hx of BCC R postauricular, R infra nasal, hx of AKs) and check spot (R upper forehead, few weeks, sore). She also has a couple other itchy growths on L side and L arm. Bump on nose gets itchy and swells.   The following portions of the chart were reviewed this encounter and updated as appropriate:       Review of Systems:  No other skin or systemic complaints except as noted in HPI or Assessment and Plan.  Objective  Well appearing patient in no apparent distress; mood and affect are within normal limits.  A full examination was performed including scalp, head, eyes, ears, nose, lips, neck, chest, axillae, abdomen, back, buttocks, bilateral upper extremities, bilateral lower extremities, hands, feet, fingers, toes, fingernails, and toenails. All findings within normal limits unless otherwise noted below.  R forehead at hairline x 1, L upper flank at braline x 1, L upper inner arm x 1, Total = 3 (2) Erythematous keratotic or waxy stuck-on papule   R ant nasal ala 3.1mm firm flesh pap     L ant nasal ala crease 2.15mm firm white pap  R para nasal Dilated pore  Right mid Forehead x 1 2 mm pink scaly papule    Assessment & Plan   Lentigines - Scattered tan macules - Due to sun exposure - Benign-appearing, observe - Recommend daily broad spectrum sunscreen SPF 30+ to sun-exposed areas, reapply every 2 hours as needed. - Call for any changes  Seborrheic Keratoses - Stuck-on, waxy, tan-brown papules and/or plaques  - Benign-appearing - Discussed benign etiology and prognosis. - Observe - Call for any changes  Melanocytic Nevi - Tan-brown and/or pink-flesh-colored symmetric macules and papules - Benign appearing on exam today - Observation - Call clinic for new or changing moles - Recommend daily use of broad spectrum spf 30+ sunscreen to  sun-exposed areas.   Hemangiomas - Red papules - Discussed benign nature - Observe - Call for any changes - R post auricular neck  Actinic Damage - Chronic condition, secondary to cumulative UV/sun exposure - diffuse scaly erythematous macules with underlying dyspigmentation - Recommend daily broad spectrum sunscreen SPF 30+ to sun-exposed areas, reapply every 2 hours as needed.  - Staying in the shade or wearing long sleeves, sun glasses (UVA+UVB protection) and wide brim hats (4-inch brim around the entire circumference of the hat) are also recommended for sun protection.  - Call for new or changing lesions.  Skin cancer screening performed today.  History of basal cell carcinoma (BCC) R post auricular, R infra nasal  Clear. Observe for recurrence. Call clinic for new or changing lesions.  Recommend regular skin exams, daily broad-spectrum spf 30+ sunscreen use, and photoprotection.    Inflamed seborrheic keratosis R forehead at hairline x 1, L upper flank at braline x 1, L upper inner arm x 1, Total = 3  Destruction of lesion - R forehead at hairline x 1, L upper flank at braline x 1, L upper inner arm x 1, Total = 3  Destruction method: cryotherapy   Informed consent: discussed and consent obtained   Lesion destroyed using liquid nitrogen: Yes   Region frozen until ice ball extended beyond lesion: Yes   Outcome: patient tolerated procedure well with no complications   Post-procedure details: wound care instructions given   Additional details:  Prior to procedure, discussed  risks of blister formation, small wound, skin dyspigmentation, or rare scar following cryotherapy. Recommend Vaseline ointment to treated areas while healing.   Neoplasm of skin R ant nasal ala  Epidermal / dermal shaving  Lesion diameter (cm):  0.4 Informed consent: discussed and consent obtained   Patient was prepped and draped in usual sterile fashion: area prepped with alcohol. Anesthesia: the  lesion was anesthetized in a standard fashion   Anesthetic:  1% lidocaine w/ epinephrine 1-100,000 buffered w/ 8.4% NaHCO3 Instrument used: flexible razor blade   Hemostasis achieved with: pressure, aluminum chloride and electrodesiccation   Outcome: patient tolerated procedure well   Post-procedure details: wound care instructions given   Post-procedure details comment:  Ointment and small bandage applied  Specimen 1 - Surgical pathology Differential Diagnosis: D48.5 Fibrous pap vs Nevus r/o BCC  Check Margins: No 3.73mm firm flesh pap  Epidermal cyst L ant nasal ala crease  Benign, observe.    Dilated pore of [place] R para nasal  Benign, observe  AK (actinic keratosis) Right mid Forehead x 1  Destruction of lesion - Right mid Forehead x 1  Destruction method: cryotherapy   Informed consent: discussed and consent obtained   Lesion destroyed using liquid nitrogen: Yes   Region frozen until ice ball extended beyond lesion: Yes   Outcome: patient tolerated procedure well with no complications   Post-procedure details: wound care instructions given   Additional details:  Prior to procedure, discussed risks of blister formation, small wound, skin dyspigmentation, or rare scar following cryotherapy. Recommend Vaseline ointment to treated areas while healing.   Return in about 1 year (around 12/27/2021) for TBSE, Hx of BCC, Hx of AKs.  I, Othelia Pulling, RMA, am acting as scribe for Brendolyn Patty, MD .  Documentation: I have reviewed the above documentation for accuracy and completeness, and I agree with the above.  Brendolyn Patty MD

## 2021-01-03 ENCOUNTER — Telehealth: Payer: Self-pay

## 2021-01-03 NOTE — Telephone Encounter (Signed)
Advised pt of bx results and scheduled pt for 02/02/21 at 10:15 for EDC./sh

## 2021-01-03 NOTE — Telephone Encounter (Signed)
-----   Message from Brendolyn Patty, MD sent at 01/03/2021  9:03 AM EDT ----- Skin , right ant nasal ala BASAL CELL CARCINOMA, MICRONODULAR PATTERN, BASE INVOLVED  BCC skin cancer, needs EDC   - please call patient

## 2021-02-02 ENCOUNTER — Other Ambulatory Visit: Payer: Self-pay

## 2021-02-02 ENCOUNTER — Ambulatory Visit: Payer: 59 | Admitting: Dermatology

## 2021-02-02 DIAGNOSIS — L578 Other skin changes due to chronic exposure to nonionizing radiation: Secondary | ICD-10-CM | POA: Diagnosis not present

## 2021-02-02 DIAGNOSIS — C44311 Basal cell carcinoma of skin of nose: Secondary | ICD-10-CM | POA: Diagnosis not present

## 2021-02-02 DIAGNOSIS — Z85828 Personal history of other malignant neoplasm of skin: Secondary | ICD-10-CM | POA: Diagnosis not present

## 2021-02-02 NOTE — Patient Instructions (Signed)
Electrodesiccation and Curettage ("Scrape and Burn") Wound Care Instructions  Leave the original bandage on for 24 hours if possible.  If the bandage becomes soaked or soiled before that time, it is OK to remove it and examine the wound.  A small amount of post-operative bleeding is normal.  If excessive bleeding occurs, remove the bandage, place gauze over the site and apply continuous pressure (no peeking) over the area for 30 minutes. If this does not work, please call our clinic as soon as possible or page your doctor if it is after hours.   Once a day, cleanse the wound with soap and water. It is fine to shower. If a thick crust develops you may use a Q-tip dipped into dilute hydrogen peroxide (mix 1:1 with water) to dissolve it.  Hydrogen peroxide can slow the healing process, so use it only as needed.    After washing, apply petroleum jelly (Vaseline) or an antibiotic ointment if your doctor prescribed one for you, followed by a bandage.    For best healing, the wound should be covered with a layer of ointment at all times. If you are not able to keep the area covered with a bandage to hold the ointment in place, this may mean re-applying the ointment several times a day.  Continue this wound care until the wound has healed and is no longer open. It may take several weeks for the wound to heal and close.  Itching and mild discomfort is normal during the healing process.  If you have any discomfort, you can take Tylenol (acetaminophen) or ibuprofen as directed on the bottle. (Please do not take these if you have an allergy to them or cannot take them for another reason).  Some redness, tenderness and white or yellow material in the wound is normal healing.  If the area becomes very sore and red, or develops a thick yellow-green material (pus), it may be infected; please notify us.    Wound healing continues for up to one year following surgery. It is not unusual to experience pain in the scar  from time to time during the interval.  If the pain becomes severe or the scar thickens, you should notify the office.    A slight amount of redness in a scar is expected for the first six months.  After six months, the redness will fade and the scar will soften and fade.  The color difference becomes less noticeable with time.  If there are any problems, return for a post-op surgery check at your earliest convenience.  To improve the appearance of the scar, you can use silicone scar gel, cream, or sheets (such as Mederma or Serica) every night for up to one year. These are available over the counter (without a prescription).  Please call our office at [phone] for any questions or concerns.   If You Need Anything After Your Visit  If you have any questions or concerns for your doctor, please call our main line at [phone] and press option 4 to reach your doctor's medical assistant. If no one answers, please leave a voicemail as directed and we will return your call as soon as possible. Messages left after 4 pm will be answered the following business day.   You may also send [place] a message via [place]. We typically respond to MyChart messages within 1-2 business days.  For prescription refills, please ask your pharmacy to contact our office. Our fax number is [phone].  If you have  an urgent issue when the clinic is closed that cannot wait until the next business day, you can page your doctor at the number below.    Please note that while we do our best to be available for urgent issues outside of office hours, we are not available 24/7.   If you have an urgent issue and are unable to reach [place], you may choose to seek medical care at your doctor's office, retail clinic, urgent care center, or emergency room.  If you have a medical emergency, please immediately call 911 or go to the emergency department.  Pager Numbers  - Dr. Nehemiah Massed: [phone]  - Dr. Laurence Ferrari: [phone]  -  Dr. Nicole Kindred: [phone]  In the event of inclement weather, please call our main line at [phone] for an update on the status of any delays or closures.  Dermatology Medication Tips: Please keep the boxes that topical medications come in in order to help keep track of the instructions about where and how to use these. Pharmacies typically print the medication instructions only on the boxes and not directly on the medication tubes.   If your medication is too expensive, please contact our office at [phone] option 4 or send [place] a message through [place].   We are unable to tell what your co-pay for medications will be in advance as this is different depending on your insurance coverage. However, we may be able to find a substitute medication at lower cost or fill out paperwork to get insurance to cover a needed medication.   If a prior authorization is required to get your medication covered by your insurance company, please allow [place] 1-2 business days to complete this process.  Drug prices often vary depending on where the prescription is filled and some pharmacies may offer cheaper prices.  The website www.goodrx.com contains coupons for medications through different pharmacies. The prices here do not account for what the cost may be with help from insurance (it may be cheaper with your insurance), but the website can give you the price if you did not use any insurance.  - You can print the associated coupon and take it with your prescription to the pharmacy.  - You may also stop by our office during regular business hours and pick up a GoodRx coupon card.  - If you need your prescription sent electronically to a different pharmacy, notify our office through [facility] or by phone at [phone] option 4.     Si Usted Necesita Algo Despus de Su Visita  Tambin puede enviarnos un mensaje a travs de Pharmacist, community. Por lo general respondemos a los mensajes de MyChart en el  transcurso de 1 a 2 das hbiles.  Para renovar recetas, por favor pida a su farmacia que se ponga en contacto con nuestra oficina. Harland Dingwall de fax es [place] [phone].  Si tiene un asunto urgente cuando la clnica est cerrada y que no puede esperar hasta el siguiente da hbil, puede llamar/localizar a su doctor(a) al nmero que aparece a continuacin.   Por favor, tenga en cuenta que aunque hacemos todo lo posible para estar disponibles para asuntos urgentes fuera del horario de [place], no estamos disponibles las 24 horas del da, los 7 das de la [place].   Si tiene un problema urgente y no puede comunicarse con nosotros, puede optar por buscar atencin mdica  en el consultorio de su doctor(a), en una clnica privada, en un centro de atencin urgente o en una sala de  emergencias.  Si tiene Engineering geologist, por favor llame inmediatamente al 911 o vaya a la sala de emergencias.  Nmeros de bper  - Dr. Nehemiah Massed: [phone]  - Dra. Moye: [phone]  - Dra. Nicole Kindred: [phone]  En caso de inclemencias del [place], por favor llame a Johnsie Kindred principal al [phone] para una actualizacin sobre el [place] de cualquier retraso o cierre.  Consejos para la medicacin en dermatologa: Por favor, guarde las cajas en las que vienen los medicamentos de uso tpico para ayudarle a seguir las instrucciones sobre dnde y cmo usarlos. Las farmacias generalmente imprimen las instrucciones del medicamento slo en las cajas y no directamente en los tubos del [place].   Si su medicamento es muy caro, por favor, pngase en contacto con Zigmund Daniel llamando al [phone] y presione la opcin 4 o envenos un mensaje a travs de Pharmacist, community.   No podemos decirle cul ser su copago por los medicamentos por adelantado ya que esto es diferente dependiendo de la cobertura de su seguro. Sin embargo, es posible que podamos encontrar un medicamento sustituto a Electrical engineer un  formulario para que el seguro cubra el medicamento que se considera necesario.   Si se requiere una autorizacin previa para que su compaa de seguros [place] su medicamento, por favor permtanos de 1 a 2 das hbiles para completar este proceso.  Los precios de los medicamentos varan con frecuencia dependiendo del Environmental consultant de dnde se surte la receta y alguna farmacias pueden ofrecer precios ms baratos.  El sitio web www.goodrx.com tiene cupones para medicamentos de Airline pilot. Los precios aqu no tienen en cuenta lo que podra costar con la ayuda del seguro (puede ser ms barato con su seguro), pero el sitio web puede darle el precio si no utiliz Research scientist (physical sciences).  - Puede imprimir el cupn correspondiente y llevarlo con su receta a la farmacia.  - Tambin puede pasar por nuestra oficina durante el horario de atencin regular y Charity fundraiser una tarjeta de cupones de GoodRx.  - Si necesita que su receta se enve electrnicamente a una farmacia diferente, informe a nuestra oficina a travs de MyChart de [place] o por telfono llamando al [phone] y presione la opcin 4.

## 2021-02-02 NOTE — Progress Notes (Signed)
   Follow-Up Visit   Subjective  Carrie Soto is a 60 y.o. female who presents for the following: Follow-up (Pt here today to treat a BCC on right anterior nasal ala with an EDC. ).   The following portions of the chart were reviewed this encounter and updated as appropriate:      Review of Systems: No other skin or systemic complaints except as noted in HPI or Assessment and Plan.   Objective  Well appearing patient in no apparent distress; mood and affect are within normal limits.  A focused examination was performed including face. Relevant physical exam findings are noted in the Assessment and Plan.  Nose Biopsy proven BCC  Assessment & Plan  Basal cell carcinoma (BCC) of skin of nose Nose  Destruction of lesion  Destruction method: electrodesiccation and curettage   Timeout:  patient name, date of birth, surgical site, and procedure verified Anesthesia: the lesion was anesthetized in a standard fashion   Anesthetic:  1% lidocaine w/ epinephrine 1-100,000 buffered w/ 8.4% NaHCO3 Curettage performed in three different directions: Yes   Electrodesiccation performed over the curetted area: Yes   Final wound size (cm):  0.8 Final wound size (cm) comment:  8 mm x 3.5 mm Hemostasis achieved with:  pressure, aluminum chloride and electrodesiccation Outcome: patient tolerated procedure well with no complications   Post-procedure details: wound care instructions given    Actinic Damage - chronic, secondary to cumulative UV radiation exposure/sun exposure over time - diffuse scaly erythematous macules with underlying dyspigmentation - Recommend daily broad spectrum sunscreen SPF 30+ to sun-exposed areas, reapply every 2 hours as needed.  - Recommend staying in the shade or wearing long sleeves, sun glasses (UVA+UVB protection) and wide brim hats (4-inch brim around the entire circumference of the hat). - Call for new or changing lesions.   History of Basal Cell Carcinoma  of the Skin - No evidence of recurrence today - Recommend regular full body skin exams - Recommend daily broad spectrum sunscreen SPF 30+ to sun-exposed areas, reapply every 2 hours as needed.  - Call if any new or changing lesions are noted between office visits   Return in about 5 months (around 07/03/2021) for recheck nose, face, ears, neck.  I, Harriett Sine, CMA, am acting as scribe for Brendolyn Patty, MD.

## 2021-02-03 ENCOUNTER — Encounter: Payer: Self-pay | Admitting: Dermatology

## 2021-06-10 ENCOUNTER — Other Ambulatory Visit: Payer: Self-pay | Admitting: Internal Medicine

## 2021-06-10 DIAGNOSIS — Z1231 Encounter for screening mammogram for malignant neoplasm of breast: Secondary | ICD-10-CM

## 2021-07-05 ENCOUNTER — Ambulatory Visit: Payer: 59 | Admitting: Dermatology

## 2021-07-05 DIAGNOSIS — D2339 Other benign neoplasm of skin of other parts of face: Secondary | ICD-10-CM | POA: Diagnosis not present

## 2021-07-05 DIAGNOSIS — C44319 Basal cell carcinoma of skin of other parts of face: Secondary | ICD-10-CM | POA: Diagnosis not present

## 2021-07-05 DIAGNOSIS — D489 Neoplasm of uncertain behavior, unspecified: Secondary | ICD-10-CM

## 2021-07-05 DIAGNOSIS — L820 Inflamed seborrheic keratosis: Secondary | ICD-10-CM | POA: Diagnosis not present

## 2021-07-05 DIAGNOSIS — Z85828 Personal history of other malignant neoplasm of skin: Secondary | ICD-10-CM | POA: Diagnosis not present

## 2021-07-05 DIAGNOSIS — L821 Other seborrheic keratosis: Secondary | ICD-10-CM | POA: Diagnosis not present

## 2021-07-05 DIAGNOSIS — D1800 Hemangioma unspecified site: Secondary | ICD-10-CM

## 2021-07-05 DIAGNOSIS — L738 Other specified follicular disorders: Secondary | ICD-10-CM

## 2021-07-05 DIAGNOSIS — L578 Other skin changes due to chronic exposure to nonionizing radiation: Secondary | ICD-10-CM

## 2021-07-05 NOTE — Patient Instructions (Addendum)
Biopsy Wound Care Instructions ? ?Leave the original bandage on for 24 hours if possible.  If the bandage becomes soaked or soiled before that time, it is OK to remove it and examine the wound.  A small amount of post-operative bleeding is normal.  If excessive bleeding occurs, remove the bandage, place gauze over the site and apply continuous pressure (no peeking) over the area for 30 minutes. If this does not work, please call our clinic as soon as possible or page your doctor if it is after hours.  ? ?Once a day, cleanse the wound with soap and water. It is fine to shower. If a thick crust develops you may use a Q-tip dipped into dilute hydrogen peroxide (mix 1:1 with water) to dissolve it.  Hydrogen peroxide can slow the healing process, so use it only as needed.   ? ?After washing, apply petroleum jelly (Vaseline) or an antibiotic ointment if your doctor prescribed one for you, followed by a bandage.   ? ?For best healing, the wound should be covered with a layer of ointment at all times. If you are not able to keep the area covered with a bandage to hold the ointment in place, this may mean re-applying the ointment several times a day.  Continue this wound care until the wound has healed and is no longer open.  ? ?Itching and mild discomfort is normal during the healing process. However, if you develop pain or severe itching, please call our office.  ? ?If you have any discomfort, you can take Tylenol (acetaminophen) or ibuprofen as directed on the bottle. (Please do not take these if you have an allergy to them or cannot take them for another reason). ? ?Some redness, tenderness and white or yellow material in the wound is normal healing.  If the area becomes very sore and red, or develops a thick yellow-green material (pus), it may be infected; please notify us.   ? ?If you have stitches, return to clinic as directed to have the stitches removed. You will continue wound care for 2-3 days after the stitches  are removed.  ? ?Wound healing continues for up to one year following surgery. It is not unusual to experience pain in the scar from time to time during the interval.  If the pain becomes severe or the scar thickens, you should notify the office.   ? ?A slight amount of redness in a scar is expected for the first six months.  After six months, the redness will fade and the scar will soften and fade.  The color difference becomes less noticeable with time.  If there are any problems, return for a post-op surgery check at your earliest convenience. ? ?To improve the appearance of the scar, you can use silicone scar gel, cream, or sheets (such as Mederma or Serica) every night for up to one year. These are available over the counter (without a prescription). ? ?Please call our office at [phone] for any questions or concerns. ? ? ? ? ? ?Seborrheic Keratosis ? ?What causes seborrheic keratoses? ?Seborrheic keratoses are harmless, common skin growths that first appear during adult life.  As time goes by, more growths appear.  Some people may develop a large number of them.  Seborrheic keratoses appear on both covered and uncovered body parts.  They are not caused by sunlight.  The tendency to develop seborrheic keratoses can be inherited.  They vary in color from skin-colored to gray, brown, or even black.  They can be either smooth or have a rough, warty surface.   ?Seborrheic keratoses are superficial and look as if they were stuck on the skin.  Under the microscope this type of keratosis looks like layers upon layers of skin.  That is why at times the top layer may seem to fall off, but the rest of the growth remains and re-grows.   ? ?Treatment ?Seborrheic keratoses do not need to be treated, but can easily be removed in the office.  Seborrheic keratoses often cause symptoms when they rub on clothing or jewelry.  Lesions can be in the way of shaving.  If they become inflamed, they can cause itching, soreness, or  burning.  Removal of a seborrheic keratosis can be accomplished by freezing, burning, or surgery. ?If any spot bleeds, scabs, or grows rapidly, please return to have it checked, as these can be an indication of a skin cancer. ? ?Cryotherapy Aftercare ? ?Wash gently with soap and water everyday.   ?Apply Vaseline and Band-Aid daily until healed.  ? ? ? ? ?If You Need Anything After Your Visit ? ?If you have any questions or concerns for your doctor, please call our main line at [phone] and press option 4 to reach your doctor's medical assistant. If no one answers, please leave a voicemail as directed and we will return your call as soon as possible. Messages left after 4 pm will be answered the following business day.  ? ?You may also send [place] a message via MyChart. We typically respond to MyChart messages within 1-2 business days. ? ?For prescription refills, please ask your pharmacy to contact our office. Our fax number is [phone]. ? ?If you have an urgent issue when the clinic is closed that cannot wait until the next business day, you can page your doctor at the number below.   ? ?Please note that while we do our best to be available for urgent issues outside of office hours, we are not available 24/7.  ? ?If you have an urgent issue and are unable to reach [place], you may choose to seek medical care at your doctor's office, retail clinic, urgent care center, or emergency room. ? ?If you have a medical emergency, please immediately call 911 or go to the emergency department. ? ?Pager Numbers ? ?- Dr. Nehemiah Massed: [phone] ? ?- Dr. Laurence Ferrari: [phone] ? ?- Dr. Nicole Kindred: [phone] ? ?In the event of inclement weather, please call our main line at [phone] for an update on the status of any delays or closures. ? ?Dermatology Medication Tips: ?Please keep the boxes that topical medications come in in order to help keep track of the instructions about where and how to use these. Pharmacies typically print  the medication instructions only on the boxes and not directly on the medication tubes.  ? ?If your medication is too expensive, please contact our office at [phone] option 4 or send [place] a message through [place].  ? ?We are unable to tell what your co-pay for medications will be in advance as this is different depending on your insurance coverage. However, we may be able to find a substitute medication at lower cost or fill out paperwork to get insurance to cover a needed medication.  ? ?If a prior authorization is required to get your medication covered by your insurance company, please allow [place] 1-2 business days to complete this process. ? ?Drug prices often vary depending on where the prescription is filled and some pharmacies may offer  cheaper prices. ? ?The website www.goodrx.com contains coupons for medications through different pharmacies. The prices here do not account for what the cost may be with help from insurance (it may be cheaper with your insurance), but the website can give you the price if you did not use any insurance.  ?- You can print the associated coupon and take it with your prescription to the pharmacy.  ?- You may also stop by our office during regular business hours and pick up a GoodRx coupon card.  ?- If you need your prescription sent electronically to a different pharmacy, notify our office through [facility] or by phone at [phone] option 4. ? ? ? ? ?Si Usted Necesita Algo Despu?s de Su Visita ? ?Tambi?n puede enviarnos un mensaje a trav?s de MyChart. Por lo general respondemos a los mensajes de MyChart en el transcurso de 1 a 2 d?as h?biles. ? ?Para renovar recetas, por favor pida a su farmacia que se ponga en contacto con nuestra oficina. Nuestro n?mero de fax es el [phone]. ? ?Si tiene un asunto urgente cuando la cl?nica est? cerrada y que no puede esperar hasta el siguiente d?a h?bil, puede llamar/localizar a su doctor(a) al n?mero que aparece a  continuaci?n.  ? ?Por favor, tenga en cuenta que aunque hacemos todo lo posible para estar disponibles para asuntos urgentes fuera del horario de oficina, no estamos disponibles las 24 horas del d?a, los 7 d?as de

## 2021-07-05 NOTE — Progress Notes (Signed)
? ?Follow-Up Visit ?  ?Subjective  ?Carrie Soto is a 61 y.o. female who presents for the following: Follow-up (5 month recheck nose, face, ears, and neck. Hx of bcc, aks, and isks. ). ? ?The patient has spots, moles and lesions to be evaluated, some may be new or changing and the patient has concerns that these could be cancer. ? ? ?The following portions of the chart were reviewed this encounter and updated as appropriate:   ?  ? ?Review of Systems: No other skin or systemic complaints except as noted in HPI or Assessment and Plan. ? ? ?Objective  ?Well appearing patient in no apparent distress; mood and affect are within normal limits. ? ?A focused examination was performed including face, nose, ears, neck, right flank. Relevant physical exam findings are noted in the Assessment and Plan. ? ?left lateral eyebrow ?5 mm pink/yellow lobulated papule  ? ? ? ? ?right flank x 1 ?Erythematous stuck-on, waxy papule or plaque ? ?right anterior nasal ala ?Healing biopsy ? ?Left Alar Crease ?2 mm flesh white papule  ? ?right para nasal ?Dilated pore ? ? ?Assessment & Plan  ?Neoplasm of uncertain behavior ?left lateral eyebrow ? ?Skin / nail biopsy ?Type of biopsy: tangential   ?Informed consent: discussed and consent obtained   ?Patient was prepped and draped in usual sterile fashion: Area prepped with alcohol. ?Anesthesia: the lesion was anesthetized in a standard fashion   ?Anesthetic:  1% lidocaine w/ epinephrine 1-100,000 buffered w/ 8.4% NaHCO3 ?Instrument used: flexible razor blade   ?Hemostasis achieved with: pressure, aluminum chloride and electrodesiccation   ?Outcome: patient tolerated procedure well   ?Post-procedure details: wound care instructions given   ?Post-procedure details comment:  Ointment and small bandage applied ? ?Specimen 1 - Surgical pathology ?Differential Diagnosis: Sebaceous hyperplasia r/o bcc  ? ?Check Margins: No ? ?Sebaceous hyperplasia r/o bcc  ? ?Inflamed seborrheic keratosis ?right  flank x 1 ? ?Destruction of lesion - right flank x 1 ? ?Destruction method: cryotherapy   ?Informed consent: discussed and consent obtained   ?Lesion destroyed using liquid nitrogen: Yes   ?Region frozen until ice ball extended beyond lesion: Yes   ?Outcome: patient tolerated procedure well with no complications   ?Post-procedure details: wound care instructions given   ?Additional details:  Prior to procedure, discussed risks of blister formation, small wound, skin dyspigmentation, or rare scar following cryotherapy. Recommend Vaseline ointment to treated areas while healing. ? ? ?History of basal cell carcinoma (BCC) ?right anterior nasal ala ? ?Bx proven bcc  ?Treated with ED&C 02/02/21 ? ?Clear. Observe for recurrence. Call clinic for new or changing lesions.  Recommend regular skin exams, daily broad-spectrum spf 30+ sunscreen use, and photoprotection.   ? ?Sebaceous hyperplasia of face ?Left Alar Crease ? ?Vs cyst ? ?Benign. Observe  ? ?Dilated pore of Winer of nose ?right para nasal ? ?Benign observe  ? ?  ? ?Seborrheic Keratoses ?- Stuck-on, waxy, tan-brown papules and/or plaques  ?- Benign-appearing ?- Discussed benign etiology and prognosis. ?- Observe ?- Call for any changes ? ?Hemangiomas ?- Red papules ?- Discussed benign nature ?- Observe ?- Call for any changes ? ?Sebaceous Hyperplasia  ?- Small yellow papules with a central dell ?- Benign ?- Observe ? ?Actinic Damage ?- chronic, secondary to cumulative UV radiation exposure/sun exposure over time ?- diffuse scaly erythematous macules with underlying dyspigmentation ?- Recommend daily broad spectrum sunscreen SPF 30+ to sun-exposed areas, reapply every 2 hours as needed.  ?- Recommend staying  in the shade or wearing long sleeves, sun glasses (UVA+UVB protection) and wide brim hats (4-inch brim around the entire circumference of the hat). ?- Call for new or changing lesions. ? ?History of Basal Cell Carcinoma of the Skin right posterior auricular   ? ?Clear. Observe for recurrence. Call clinic for new or changing lesions.  Recommend regular skin exams, daily broad-spectrum spf 30+ sunscreen use, and photoprotection.   ? ?- No evidence of recurrence today ?- Recommend regular full body skin exams ?- Recommend daily broad spectrum sunscreen SPF 30+ to sun-exposed areas, reapply every 2 hours as needed.  ?- Call if any new or changing lesions are noted between office visits ? ?Return for keep follow up as schedule 10 / 31. ?I, Ruthell Rummage, CMA, am acting as scribe for Brendolyn Patty, MD. ? ?Documentation: I have reviewed the above documentation for accuracy and completeness, and I agree with the above. ? ?Brendolyn Patty MD  ?

## 2021-07-11 ENCOUNTER — Telehealth: Payer: Self-pay

## 2021-07-11 NOTE — Telephone Encounter (Signed)
Advised patient biopsy of the left lateral eyebrow was BCC. EDC scheduled for 08/09/21 at 1:30pm. ?

## 2021-07-11 NOTE — Telephone Encounter (Signed)
-----   Message from Brendolyn Patty, MD sent at 07/11/2021  1:05 PM EDT ----- ?Skin , left lateral eyebrow ?BASAL CELL CARCINOMA, NODULAR PATTERN, BASE INVOLVED ? ?BCC skin cancer, needs EDC - please call patient ?

## 2021-07-15 ENCOUNTER — Ambulatory Visit
Admission: RE | Admit: 2021-07-15 | Discharge: 2021-07-15 | Disposition: A | Discharge status: Home / Self Care | Payer: 59 | Source: Ambulatory Visit | Attending: Internal Medicine | Admitting: Internal Medicine

## 2021-07-15 DIAGNOSIS — Z1231 Encounter for screening mammogram for malignant neoplasm of breast: Secondary | ICD-10-CM | POA: Insufficient documentation

## 2021-07-18 ENCOUNTER — Other Ambulatory Visit: Payer: Self-pay | Admitting: Internal Medicine

## 2021-07-21 ENCOUNTER — Other Ambulatory Visit: Payer: Self-pay | Admitting: Internal Medicine

## 2021-07-21 DIAGNOSIS — R921 Mammographic calcification found on diagnostic imaging of breast: Secondary | ICD-10-CM

## 2021-07-21 DIAGNOSIS — R928 Other abnormal and inconclusive findings on diagnostic imaging of breast: Secondary | ICD-10-CM

## 2021-08-09 ENCOUNTER — Ambulatory Visit: Payer: 59 | Admitting: Dermatology

## 2021-08-09 ENCOUNTER — Ambulatory Visit
Admission: RE | Admit: 2021-08-09 | Discharge: 2021-08-09 | Disposition: A | Discharge status: Home / Self Care | Payer: 59 | Source: Ambulatory Visit | Attending: Internal Medicine | Admitting: Internal Medicine

## 2021-08-09 DIAGNOSIS — C44319 Basal cell carcinoma of skin of other parts of face: Secondary | ICD-10-CM | POA: Diagnosis not present

## 2021-08-09 DIAGNOSIS — Z85828 Personal history of other malignant neoplasm of skin: Secondary | ICD-10-CM

## 2021-08-09 DIAGNOSIS — L578 Other skin changes due to chronic exposure to nonionizing radiation: Secondary | ICD-10-CM

## 2021-08-09 DIAGNOSIS — R928 Other abnormal and inconclusive findings on diagnostic imaging of breast: Secondary | ICD-10-CM | POA: Insufficient documentation

## 2021-08-09 DIAGNOSIS — R921 Mammographic calcification found on diagnostic imaging of breast: Secondary | ICD-10-CM | POA: Diagnosis present

## 2021-08-09 NOTE — Progress Notes (Signed)
   Follow-Up Visit   Subjective  Carrie Soto is a 61 y.o. female who presents for the following: Bx proven BCC (left lateral eyebrow - patient is here today for treatment with ED&C).   The following portions of the chart were reviewed this encounter and updated as appropriate:       Review of Systems:  No other skin or systemic complaints except as noted in HPI or Assessment and Plan.  Objective  Well appearing patient in no apparent distress; mood and affect are within normal limits.  A focused examination was performed including the face. Relevant physical exam findings are noted in the Assessment and Plan.  left lateral eyebrow Pink biopsy site.     Assessment & Plan  Basal cell carcinoma (BCC) of skin of other part of face left lateral eyebrow  Destruction of lesion  Destruction method: electrodesiccation and curettage   Informed consent: discussed and consent obtained   Timeout:  patient name, date of birth, surgical site, and procedure verified Anesthesia: the lesion was anesthetized in a standard fashion   Anesthetic:  1% lidocaine w/ epinephrine 1-100,000 buffered w/ 8.4% NaHCO3 Curettage performed in three different directions: Yes   Electrodesiccation performed over the curetted area: Yes   Final wound size (cm):  0.6 Hemostasis achieved with:  pressure, aluminum chloride and electrodesiccation Outcome: patient tolerated procedure well with no complications   Post-procedure details: wound care instructions given   Additional details:  Mupirocin ointment and Bandaid applied     Actinic Damage - chronic, secondary to cumulative UV radiation exposure/sun exposure over time - diffuse scaly erythematous macules with underlying dyspigmentation - Recommend daily broad spectrum sunscreen SPF 30+ to sun-exposed areas, reapply every 2 hours as needed.  - Recommend staying in the shade or wearing long sleeves, sun glasses (UVA+UVB protection) and wide brim hats  (4-inch brim around the entire circumference of the hat). - Call for new or changing lesions.  History of Basal Cell Carcinoma of the Skin - No evidence of recurrence today - Recommend regular full body skin exams - Recommend daily broad spectrum sunscreen SPF 30+ to sun-exposed areas, reapply every 2 hours as needed.  - Call if any new or changing lesions are noted between office visits   Return for appointment as scheduled.  Luther Redo, CMA, am acting as scribe for Brendolyn Patty, MD .  Documentation: I have reviewed the above documentation for accuracy and completeness, and I agree with the above.  Brendolyn Patty MD

## 2021-08-09 NOTE — Patient Instructions (Signed)
Electrodesiccation and Curettage ("Scrape and Burn") Wound Care Instructions  Leave the original bandage on for 24 hours if possible.  If the bandage becomes soaked or soiled before that time, it is OK to remove it and examine the wound.  A small amount of post-operative bleeding is normal.  If excessive bleeding occurs, remove the bandage, place gauze over the site and apply continuous pressure (no peeking) over the area for 30 minutes. If this does not work, please call our clinic as soon as possible or page your doctor if it is after hours.   Once a day, cleanse the wound with soap and water. It is fine to shower. If a thick crust develops you may use a Q-tip dipped into dilute hydrogen peroxide (mix 1:1 with water) to dissolve it.  Hydrogen peroxide can slow the healing process, so use it only as needed.    After washing, apply petroleum jelly (Vaseline) or an antibiotic ointment if your doctor prescribed one for you, followed by a bandage.    For best healing, the wound should be covered with a layer of ointment at all times. If you are not able to keep the area covered with a bandage to hold the ointment in place, this may mean re-applying the ointment several times a day.  Continue this wound care until the wound has healed and is no longer open. It may take several weeks for the wound to heal and close.  Itching and mild discomfort is normal during the healing process.  If you have any discomfort, you can take Tylenol (acetaminophen) or ibuprofen as directed on the bottle. (Please do not take these if you have an allergy to them or cannot take them for another reason).  Some redness, tenderness and white or yellow material in the wound is normal healing.  If the area becomes very sore and red, or develops a thick yellow-green material (pus), it may be infected; please notify us.    Wound healing continues for up to one year following surgery. It is not unusual to experience pain in the scar  from time to time during the interval.  If the pain becomes severe or the scar thickens, you should notify the office.    A slight amount of redness in a scar is expected for the first six months.  After six months, the redness will fade and the scar will soften and fade.  The color difference becomes less noticeable with time.  If there are any problems, return for a post-op surgery check at your earliest convenience.  To improve the appearance of the scar, you can use silicone scar gel, cream, or sheets (such as Mederma or Serica) every night for up to one year. These are available over the counter (without a prescription).  Please call our office at [phone] for any questions or concerns.     Due to recent changes in healthcare laws, you may see results of your pathology and/or laboratory studies on MyChart before the doctors have had a chance to review them. We understand that in some cases there may be results that are confusing or concerning to you. Please understand that not all results are received at the same time and often the doctors may need to interpret multiple results in order to provide you with the best plan of care or course of treatment. Therefore, we ask that you please give us 2 business days to thoroughly review all your results before contacting the office for clarification. Should   we see a critical lab result, you will be contacted sooner.   If You Need Anything After Your Visit  If you have any questions or concerns for your doctor, please call our main line at [phone] and press option 4 to reach your doctor's medical assistant. If no one answers, please leave a voicemail as directed and we will return your call as soon as possible. Messages left after 4 pm will be answered the following business day.   You may also send us a message via MyChart. We typically respond to MyChart messages within 1-2 business days.  For prescription refills, please ask your  pharmacy to contact our office. Our fax number is [phone].  If you have an urgent issue when the clinic is closed that cannot wait until the next business day, you can page your doctor at the number below.    Please note that while we do our best to be available for urgent issues outside of office hours, we are not available 24/7.   If you have an urgent issue and are unable to reach us, you may choose to seek medical care at your doctor's office, retail clinic, urgent care center, or emergency room.  If you have a medical emergency, please immediately call 911 or go to the emergency department.  Pager Numbers  - Dr. Kowalski: [phone]  - Dr. Moye: [phone]  - Dr. Stewart: [phone]  In the event of inclement weather, please call our main line at [phone] for an update on the status of any delays or closures.  Dermatology Medication Tips: Please keep the boxes that topical medications come in in order to help keep track of the instructions about where and how to use these. Pharmacies typically print the medication instructions only on the boxes and not directly on the medication tubes.   If your medication is too expensive, please contact our office at [phone] option 4 or send us a message through MyChart.   We are unable to tell what your co-pay for medications will be in advance as this is different depending on your insurance coverage. However, we may be able to find a substitute medication at lower cost or fill out paperwork to get insurance to cover a needed medication.   If a prior authorization is required to get your medication covered by your insurance company, please allow us 1-2 business days to complete this process.  Drug prices often vary depending on where the prescription is filled and some pharmacies may offer cheaper prices.  The website www.goodrx.com contains coupons for medications through different pharmacies. The prices here do not  account for what the cost may be with help from insurance (it may be cheaper with your insurance), but the website can give you the price if you did not use any insurance.  - You can print the associated coupon and take it with your prescription to the pharmacy.  - You may also stop by our office during regular business hours and pick up a GoodRx coupon card.  - If you need your prescription sent electronically to a different pharmacy, notify our office through [place] MyChart or by phone at [phone] option 4.     Si Usted Necesita Algo Despus de Su Visita  Tambin puede enviarnos un mensaje a travs de MyChart. Por lo general respondemos a los mensajes de MyChart en el transcurso de 1 a 2 das hbiles.  Para renovar recetas, por favor pida a su farmacia que se ponga en   contacto con nuestra oficina. Nuestro nmero de fax es el [phone].  Si tiene un asunto urgente cuando la clnica est cerrada y que no puede esperar hasta el siguiente da hbil, puede llamar/localizar a su doctor(a) al nmero que aparece a continuacin.   Por favor, tenga en cuenta que aunque hacemos todo lo posible para estar disponibles para asuntos urgentes fuera del horario de oficina, no estamos disponibles las 24 horas del da, los 7 das de la semana.   Si tiene un problema urgente y no puede comunicarse con nosotros, puede optar por buscar atencin mdica  en el consultorio de su doctor(a), en una clnica privada, en un centro de atencin urgente o en una sala de emergencias.  Si tiene una emergencia mdica, por favor llame inmediatamente al 911 o vaya a la sala de emergencias.  Nmeros de bper  - Dr. Kowalski: [phone]  - Dra. Moye: [phone]  - Dra. Stewart: [phone]  En caso de inclemencias del tiempo, por favor llame a nuestra lnea principal al [phone] para una actualizacin sobre el estado de cualquier retraso o cierre.  Consejos para la medicacin en dermatologa: Por  favor, guarde las cajas en las que vienen los medicamentos de uso tpico para ayudarle a seguir las instrucciones sobre dnde y cmo usarlos. Las farmacias generalmente imprimen las instrucciones del medicamento slo en las cajas y no directamente en los tubos del medicamento.   Si su medicamento es muy caro, por favor, pngase en contacto con nuestra oficina llamando al [phone] y presione la opcin 4 o envenos un mensaje a travs de MyChart.   No podemos decirle cul ser su copago por los medicamentos por adelantado ya que esto es diferente dependiendo de la cobertura de su seguro. Sin embargo, es posible que podamos encontrar un medicamento sustituto a menor costo o llenar un formulario para que el seguro cubra el medicamento que se considera necesario.   Si se requiere una autorizacin previa para que su compaa de seguros cubra su medicamento, por favor permtanos de 1 a 2 das hbiles para completar este proceso.  Los precios de los medicamentos varan con frecuencia dependiendo del lugar de dnde se surte la receta y alguna farmacias pueden ofrecer precios ms baratos.  El sitio web www.goodrx.com tiene cupones para medicamentos de diferentes farmacias. Los precios aqu no tienen en cuenta lo que podra costar con la ayuda del seguro (puede ser ms barato con su seguro), pero el sitio web puede darle el precio si no utiliz ningn seguro.  - Puede imprimir el cupn correspondiente y llevarlo con su receta a la farmacia.  - Tambin puede pasar por nuestra oficina durante el horario de atencin regular y recoger una tarjeta de cupones de GoodRx.  - Si necesita que su receta se enve electrnicamente a una farmacia diferente, informe a nuestra oficina a travs de MyChart de [place] o por telfono llamando al [phone] y presione la opcin 4.  

## 2021-09-22 ENCOUNTER — Other Ambulatory Visit: Payer: Self-pay | Admitting: Internal Medicine

## 2021-09-26 ENCOUNTER — Other Ambulatory Visit: Payer: Self-pay | Admitting: Internal Medicine

## 2021-09-26 DIAGNOSIS — R921 Mammographic calcification found on diagnostic imaging of breast: Secondary | ICD-10-CM

## 2022-01-03 ENCOUNTER — Ambulatory Visit (INDEPENDENT_AMBULATORY_CARE_PROVIDER_SITE_OTHER): Payer: 59 | Admitting: Dermatology

## 2022-01-03 DIAGNOSIS — L814 Other melanin hyperpigmentation: Secondary | ICD-10-CM

## 2022-01-03 DIAGNOSIS — L820 Inflamed seborrheic keratosis: Secondary | ICD-10-CM

## 2022-01-03 DIAGNOSIS — L719 Rosacea, unspecified: Secondary | ICD-10-CM | POA: Diagnosis not present

## 2022-01-03 DIAGNOSIS — L821 Other seborrheic keratosis: Secondary | ICD-10-CM

## 2022-01-03 DIAGNOSIS — L738 Other specified follicular disorders: Secondary | ICD-10-CM

## 2022-01-03 DIAGNOSIS — D229 Melanocytic nevi, unspecified: Secondary | ICD-10-CM

## 2022-01-03 DIAGNOSIS — Z85828 Personal history of other malignant neoplasm of skin: Secondary | ICD-10-CM

## 2022-01-03 DIAGNOSIS — Z1283 Encounter for screening for malignant neoplasm of skin: Secondary | ICD-10-CM

## 2022-01-03 DIAGNOSIS — L578 Other skin changes due to chronic exposure to nonionizing radiation: Secondary | ICD-10-CM

## 2022-01-03 MED ORDER — METRONIDAZOLE 0.75 % EX GEL: CUTANEOUS | 11 refills | Status: AC

## 2022-01-03 NOTE — Patient Instructions (Addendum)
Rosacea is a chronic progressive skin condition usually affecting the face of adults, causing redness and/or acne bumps. It is treatable but not curable. It sometimes affects the eyes (ocular rosacea) as well. It may respond to topical and/or systemic medication and can flare with stress, sun exposure, alcohol, exercise, topical steroids (including hydrocortisone/cortisone 10) and some foods.  Daily application of broad spectrum spf 30+ sunscreen to face is recommended to reduce flares.  Continue metronidazole 0.75 % gel - apply daily to twice daily to mid face and nose for rosacea.     Melanoma ABCDEs  Melanoma is the most dangerous type of skin cancer, and is the leading cause of death from skin disease.  You are more likely to develop melanoma if you: Have light-colored skin, light-colored eyes, or red or blond hair Spend a lot of time in the sun Tan regularly, either outdoors or in a tanning bed Have had blistering sunburns, especially during childhood Have a close family member who has had a melanoma Have atypical moles or large birthmarks  Early detection of melanoma is key since treatment is typically straightforward and cure rates are extremely high if we catch it early.   The first sign of melanoma is often a change in a mole or a new dark spot.  The ABCDE system is a way of remembering the signs of melanoma.  A for asymmetry:  The two halves do not match. B for border:  The edges of the growth are irregular. C for color:  A mixture of colors are present instead of an even brown color. D for diameter:  Melanomas are usually (but not always) greater than 30m - the size of a pencil eraser. E for evolution:  The spot keeps changing in size, shape, and color.  Please check your skin once per month between visits. You can use a small mirror in front and a large mirror behind you to keep an eye on the back side or your body.   If you see any new or changing lesions before your next  follow-up, please call to schedule a visit.  Please continue daily skin protection including broad spectrum sunscreen SPF 30+ to sun-exposed areas, reapplying every 2 hours as needed when you're outdoors.   Staying in the shade or wearing long sleeves, sun glasses (UVA+UVB protection) and wide brim hats (4-inch brim around the entire circumference of the hat) are also recommended for sun protection.    Due to recent changes in healthcare laws, you may see results of your pathology and/or laboratory studies on MyChart before the doctors have had a chance to review them. We understand that in some cases there may be results that are confusing or concerning to you. Please understand that not all results are received at the same time and often the doctors may need to interpret multiple results in order to provide you with the best plan of care or course of treatment. Therefore, we ask that you please give u[place]2 business days to thoroughly review all your results before contacting the office for clarification. Should we see a critical lab result, you will be contacted sooner.   If You Need Anything After Your Visit  If you have any questions or concerns for your doctor, please call our main line at [phone] press option 4 to reach your doctor's medical assistant. If no one answers, please leave a voicemail as directed and we will return your call as soon as possible. Messages left after 4  pm will be answered the following business day.   You may also send [place] a message via [place]. We typically respond to MyChart messages within 1-2 business days.  For prescription refills, please ask your pharmacy to contact our office. Our fax number is [phone].  If you have an urgent issue when the clinic is closed that cannot wait until the next business day, you can page your doctor at the number below.    Please note that while we do our best to be available for urgent issues outside of office hours,  we are not available 24/7.   If you have an urgent issue and are unable to reach [place], you may choose to seek medical care at your doctor's office, retail clinic, urgent care center, or emergency room.  If you have a medical emergency, please immediately call 911 or go to the emergency department.  Pager Numbers  - Dr. Nehemiah Massed: [phone]  - Dr. Laurence Ferrari: [phone]  - Dr. Nicole Kindred: [phone]  In the event of inclement weather, please call our main line at [phone] for an update on the status of any delays or closures.  Dermatology Medication Tips: Please keep the boxes that topical medications come in in order to help keep track of the instructions about where and how to use these. Pharmacies typically print the medication instructions only on the boxes and not directly on the medication tubes.   If your medication is too expensive, please contact our office at [phone] option 4 or send [place] a message through [place].   We are unable to tell what your co-pay for medications will be in advance as this is different depending on your insurance coverage. However, we may be able to find a substitute medication at lower cost or fill out paperwork to get insurance to cover a needed medication.   If a prior authorization is required to get your medication covered by your insurance company, please allow [place] 1-2 business days to complete this process.  Drug prices often vary depending on where the prescription is filled and some pharmacies may offer cheaper prices.  The website www.goodrx.com contains coupons for medications through different pharmacies. The prices here do not account for what the cost may be with help from insurance (it may be cheaper with your insurance), but the website can give you the price if you did not use any insurance.  - You can print the associated coupon and take it with your prescription to the pharmacy.  - You may also stop by our office during regular  business hours and pick up a GoodRx coupon card.  - If you need your prescription sent electronically to a different pharmacy, notify our office through [facility] or by phone at [phone] option 4.     Si Usted Necesita Algo Despus de Su Visita  Tambin puede enviarnos un mensaje a travs de Pharmacist, community. Por lo general respondemos a los mensajes de MyChart en el transcurso de 1 a 2 das hbiles.  Para renovar recetas, por favor pida a su farmacia que se ponga en contacto con nuestra oficina. Harland Dingwall de fax es [place] [phone].  Si tiene un asunto urgente cuando la clnica est cerrada y que no puede esperar hasta el siguiente da hbil, puede llamar/localizar a su doctor(a) al nmero que aparece a continuacin.   Por favor, tenga en cuenta que aunque hacemos todo lo posible para estar disponibles para asuntos urgentes fuera del horario de oficina, no estamos disponibles las 24  horas del Training and development officer, los 7 das de la [place].   Si tiene un problema urgente y no puede comunicarse con nosotros, puede optar por buscar atencin mdica  en el consultorio de su doctor(a), en una clnica privada, en un centro de atencin urgente o en una sala de emergencias.  Si tiene Engineering geologist, por favor llame inmediatamente al 911 o vaya a la sala de emergencias.  Nmeros de bper  - Dr. Nehemiah Massed: [phone]  - Dra. Moye: [phone]  - Dra. Nicole Kindred: [phone]  En caso de inclemencias del [place], por favor llame a Johnsie Kindred principal al [phone] para una actualizacin sobre el [place] de cualquier retraso o cierre.  Consejos para la medicacin en dermatologa: Por favor, guarde las cajas en las que vienen los medicamentos de uso tpico para ayudarle a seguir las instrucciones sobre dnde y cmo usarlos. Las farmacias generalmente imprimen las instrucciones del medicamento slo en las cajas y no directamente en los tubos del [place].   Si su medicamento es muy caro, por  favor, pngase en contacto con Zigmund Daniel llamando al [phone] y presione la opcin 4 o envenos un mensaje a travs de Pharmacist, community.   No podemos decirle cul ser su copago por los medicamentos por adelantado ya que esto es diferente dependiendo de la cobertura de su seguro. Sin embargo, es posible que podamos encontrar un medicamento sustituto a Electrical engineer un formulario para que el seguro cubra el medicamento que se considera necesario.   Si se requiere una autorizacin previa para que su compaa de seguros [place] su medicamento, por favor permtanos de 1 a 2 das hbiles para completar este proceso.  Los precios de los medicamentos varan con frecuencia dependiendo del Environmental consultant de dnde se surte la receta y alguna farmacias pueden ofrecer precios ms baratos.  El sitio web www.goodrx.com tiene cupones para medicamentos de Airline pilot. Los precios aqu no tienen en cuenta lo que podra costar con la ayuda del seguro (puede ser ms barato con su seguro), pero el sitio web puede darle el precio si no utiliz Research scientist (physical sciences).  - Puede imprimir el cupn correspondiente y llevarlo con su receta a la farmacia.  - Tambin puede pasar por nuestra oficina durante el horario de atencin regular y Charity fundraiser una tarjeta de cupones de GoodRx.  - Si necesita que su receta se enve electrnicamente a una farmacia diferente, informe a nuestra oficina a travs de MyChart de [place] o por telfono llamando al [phone] y presione la opcin 4.

## 2022-01-03 NOTE — Progress Notes (Signed)
Follow-Up Visit   Subjective  Carrie Soto is a 61 y.o. female who presents for the following: Annual Exam (1 year tbse. Hx of bcc, hx aks, hx of rosacea. Mentions bump at right side of face would like refill of cream for rosacea. ). She also has itchy spot under R breast.  The patient presents for Total-Body Skin Exam (TBSE) for skin cancer screening and mole check.  The patient has spots, moles and lesions to be evaluated, some may be new or changing and the patient has concerns that these could be cancer.   The following portions of the chart were reviewed this encounter and updated as appropriate:      Review of Systems: No other skin or systemic complaints except as noted in HPI or Assessment and Plan.   Objective  Well appearing patient in no apparent distress; mood and affect are within normal limits.  A full examination was performed including scalp, head, eyes, ears, nose, lips, neck, chest, axillae, abdomen, back, buttocks, bilateral upper extremities, bilateral lower extremities, hands, feet, fingers, toes, fingernails, and toenails. All findings within normal limits unless otherwise noted below.  nose and malar cheeks Erythema with pink bumps at nose and malar cheeks   right Inframammary x 1 Erythematous stuck-on, waxy papule or plaque   Assessment & Plan  Rosacea nose and malar cheeks  Chronic and persistent condition with duration or expected duration over one year. Condition is symptomatic/ bothersome to patient. Not currently at goal.   Rosacea is a chronic progressive skin condition usually affecting the face of adults, causing redness and/or acne bumps. It is treatable but not curable. It sometimes affects the eyes (ocular rosacea) as well. It may respond to topical and/or systemic medication and can flare with stress, sun exposure, alcohol, exercise, topical steroids (including hydrocortisone/cortisone 10) and some foods.  Daily application of broad spectrum  spf 30+ sunscreen to face is recommended to reduce flares.  restart metrogel 0.75% apply topically to face qd/bid   metroNIDAZOLE (METROGEL) 0.75 % gel - nose and malar cheeks Apply topically qd/bid to aa for rosacea  Inflamed seborrheic keratosis right Inframammary x 1  Symptomatic, irritating, patient would like treated.  Destruction of lesion - right Inframammary x 1  Destruction method: cryotherapy   Informed consent: discussed and consent obtained   Lesion destroyed using liquid nitrogen: Yes   Region frozen until ice ball extended beyond lesion: Yes   Outcome: patient tolerated procedure well with no complications   Post-procedure details: wound care instructions given   Additional details:  Prior to procedure, discussed risks of blister formation, small wound, skin dyspigmentation, or rare scar following cryotherapy. Recommend Vaseline ointment to treated areas while healing.   Lentigines - Scattered tan macules - Due to sun exposure - Benign-appearing, observe - Recommend daily broad spectrum sunscreen SPF 30+ to sun-exposed areas, reapply every 2 hours as needed. - Call for any changes  Sebaceous Hyperplasia vs fibrous papule  2 mm flesh papule at left anterior alar crease.  - Benign - Observe  Dilated pore of [place]iner of nose right para nasal   Benign observe   Sebaceous Hyperplasia - Small yellow papules with a central dell - Benign - Observe   Seborrheic Keratoses - Stuck-on, waxy, tan-brown papules and/or plaques  - Benign-appearing - Discussed benign etiology and prognosis. - Observe - Call for any changes  Melanocytic Nevi - Tan-brown and/or pink-flesh-colored symmetric macules and papules - Benign appearing on exam today - Observation -  Call clinic for new or changing moles - Recommend daily use of broad spectrum spf 30+ sunscreen to sun-exposed areas.   Hemangiomas - Red papules - Discussed benign nature - Observe - Call for any  changes  Actinic Damage - Chronic condition, secondary to cumulative UV/sun exposure - diffuse erythematous macules with underlying dyspigmentation - Recommend daily broad spectrum sunscreen SPF 30+ to sun-exposed areas, reapply every 2 hours as needed.  - Staying in the shade or wearing long sleeves, sun glasses (UVA+UVB protection) and wide brim hats (4-inch brim around the entire circumference of the hat) are also recommended for sun protection.  - Call for new or changing lesions.  History of Basal Cell Carcinoma of the Skin Including L lateral eyebrow Multiple locations see history  - No evidence of recurrence today - Recommend regular full body skin exams - Recommend daily broad spectrum sunscreen SPF 30+ to sun-exposed areas, reapply every 2 hours as needed.  - Call if any new or changing lesions are noted between office visits  Skin cancer screening performed today. Return for 6 month hx of bcc f/u rosacea .  I, Ruthell Rummage, CMA, am acting as scribe for Brendolyn Patty, MD.  Documentation: I have reviewed the above documentation for accuracy and completeness, and I agree with the above.  Brendolyn Patty MD

## 2022-02-09 ENCOUNTER — Ambulatory Visit
Admission: RE | Admit: 2022-02-09 | Discharge: 2022-02-09 | Disposition: A | Discharge status: Home / Self Care | Payer: 59 | Source: Ambulatory Visit | Attending: Internal Medicine | Admitting: Internal Medicine

## 2022-02-09 DIAGNOSIS — R921 Mammographic calcification found on diagnostic imaging of breast: Secondary | ICD-10-CM | POA: Insufficient documentation

## 2022-07-10 ENCOUNTER — Encounter: Payer: Self-pay | Admitting: Dermatology

## 2022-07-10 ENCOUNTER — Ambulatory Visit: Payer: 59 | Admitting: Dermatology

## 2022-07-10 VITALS — BP 122/74 | HR 66

## 2022-07-10 DIAGNOSIS — D229 Melanocytic nevi, unspecified: Secondary | ICD-10-CM

## 2022-07-10 DIAGNOSIS — Z85828 Personal history of other malignant neoplasm of skin: Secondary | ICD-10-CM

## 2022-07-10 DIAGNOSIS — Z1283 Encounter for screening for malignant neoplasm of skin: Secondary | ICD-10-CM | POA: Diagnosis not present

## 2022-07-10 DIAGNOSIS — L578 Other skin changes due to chronic exposure to nonionizing radiation: Secondary | ICD-10-CM

## 2022-07-10 DIAGNOSIS — L988 Other specified disorders of the skin and subcutaneous tissue: Secondary | ICD-10-CM

## 2022-07-10 DIAGNOSIS — L719 Rosacea, unspecified: Secondary | ICD-10-CM | POA: Diagnosis not present

## 2022-07-10 DIAGNOSIS — L821 Other seborrheic keratosis: Secondary | ICD-10-CM

## 2022-07-10 DIAGNOSIS — L820 Inflamed seborrheic keratosis: Secondary | ICD-10-CM | POA: Diagnosis not present

## 2022-07-10 DIAGNOSIS — L738 Other specified follicular disorders: Secondary | ICD-10-CM

## 2022-07-10 DIAGNOSIS — L814 Other melanin hyperpigmentation: Secondary | ICD-10-CM

## 2022-07-10 DIAGNOSIS — L918 Other hypertrophic disorders of the skin: Secondary | ICD-10-CM

## 2022-07-10 DIAGNOSIS — L905 Scar conditions and fibrosis of skin: Secondary | ICD-10-CM

## 2022-07-10 DIAGNOSIS — D2271 Melanocytic nevi of right lower limb, including hip: Secondary | ICD-10-CM

## 2022-07-10 NOTE — Patient Instructions (Addendum)
Cryotherapy Aftercare  Wash gently with soap and water everyday.   Apply Vaseline and Band-Aid daily until healed.   Seborrheic Keratosis  What causes seborrheic keratoses? Seborrheic keratoses are harmless, common skin growths that first appear during adult life.  As time goes by, more growths appear.  Some people may develop a large number of them.  Seborrheic keratoses appear on both covered and uncovered body parts.  They are not caused by sunlight.  The tendency to develop seborrheic keratoses can be inherited.  They vary in color from skin-colored to gray, brown, or even black.  They can be either smooth or have a rough, warty surface.   Seborrheic keratoses are superficial and look as if they were stuck on the skin.  Under the microscope this type of keratosis looks like layers upon layers of skin.  That is why at times the top layer may seem to fall off, but the rest of the growth remains and re-grows.    Treatment Seborrheic keratoses do not need to be treated, but can easily be removed in the office.  Seborrheic keratoses often cause symptoms when they rub on clothing or jewelry.  Lesions can be in the way of shaving.  If they become inflamed, they can cause itching, soreness, or burning.  Removal of a seborrheic keratosis can be accomplished by freezing, burning, or surgery. If any spot bleeds, scabs, or grows rapidly, please return to have it checked, as these can be an indication of a skin cancer.    Melanoma ABCDEs  Melanoma is the most dangerous type of skin cancer, and is the leading cause of death from skin disease.  You are more likely to develop melanoma if you: Have light-colored skin, light-colored eyes, or red or blond hair Spend a lot of time in the sun Tan regularly, either outdoors or in a tanning bed Have had blistering sunburns, especially during childhood Have a close family member who has had a melanoma Have atypical moles or large birthmarks  Early detection  of melanoma is key since treatment is typically straightforward and cure rates are extremely high if we catch it early.   The first sign of melanoma is often a change in a mole or a new dark spot.  The ABCDE system is a way of remembering the signs of melanoma.  A for asymmetry:  The two halves do not match. B for border:  The edges of the growth are irregular. C for color:  A mixture of colors are present instead of an even brown color. D for diameter:  Melanomas are usually (but not always) greater than 6mm - the size of a pencil eraser. E for evolution:  The spot keeps changing in size, shape, and color.  Please check your skin once per month between visits. You can use a small mirror in front and a large mirror behind you to keep an eye on the back side or your body.   If you see any new or changing lesions before your next follow-up, please call to schedule a visit.  Please continue daily skin protection including broad spectrum sunscreen SPF 30+ to sun-exposed areas, reapplying every 2 hours as needed when you're outdoors.   Staying in the shade or wearing long sleeves, sun glasses (UVA+UVB protection) and wide brim hats (4-inch brim around the entire circumference of the hat) are also recommended for sun protection.    Due to recent changes in healthcare laws, you may see results of your pathology and/or   laboratory studies on MyChart before the doctors have had a chance to review them. We understand that in some cases there may be results that are confusing or concerning to you. Please understand that not all results are received at the same time and often the doctors may need to interpret multiple results in order to provide you with the best plan of care or course of treatment. Therefore, we ask that you please give us 2 business days to thoroughly review all your results before contacting the office for clarification. Should we see a critical lab result, you will be contacted  sooner.   If You Need Anything After Your Visit  If you have any questions or concerns for your doctor, please call our main line at [phone] and press option 4 to reach your doctor's medical assistant. If no one answers, please leave a voicemail as directed and we will return your call as soon as possible. Messages left after 4 pm will be answered the following business day.   You may also send us a message via MyChart. We typically respond to MyChart messages within 1-2 business days.  For prescription refills, please ask your pharmacy to contact our office. Our fax number is [phone].  If you have an urgent issue when the clinic is closed that cannot wait until the next business day, you can page your doctor at the number below.    Please note that while we do our best to be available for urgent issues outside of office hours, we are not available 24/7.   If you have an urgent issue and are unable to reach us, you may choose to seek medical care at your doctor's office, retail clinic, urgent care center, or emergency room.  If you have a medical emergency, please immediately call 911 or go to the emergency department.  Pager Numbers  - Dr. Kowalski: [phone]  - Dr. Moye: [phone]  - Dr. Stewart: [phone]  In the event of inclement weather, please call our main line at [phone] for an update on the status of any delays or closures.  Dermatology Medication Tips: Please keep the boxes that topical medications come in in order to help keep track of the instructions about where and how to use these. Pharmacies typically print the medication instructions only on the boxes and not directly on the medication tubes.   If your medication is too expensive, please contact our office at [phone] option 4 or send us a message through MyChart.   We are unable to tell what your co-pay for medications will be in advance as this is different depending on your insurance  coverage. However, we may be able to find a substitute medication at lower cost or fill out paperwork to get insurance to cover a needed medication.   If a prior authorization is required to get your medication covered by your insurance company, please allow us 1-2 business days to complete this process.  Drug prices often vary depending on where the prescription is filled and some pharmacies may offer cheaper prices.  The website www.goodrx.com contains coupons for medications through different pharmacies. The prices here do not account for what the cost may be with help from insurance (it may be cheaper with your insurance), but the website can give you the price if you did not use any insurance.  - You can print the associated coupon and take it with your prescription to the pharmacy.  - You may also stop by our office   during regular business hours and pick up a GoodRx coupon card.  - If you need your prescription sent electronically to a different pharmacy, notify our office through [facility] MyChart or by phone at [phone] option 4.     Si Usted Necesita Algo Despus de Su Visita  Tambin puede enviarnos un mensaje a travs de MyChart. Por lo general respondemos a los mensajes de MyChart en el transcurso de 1 a 2 das hbiles.  Para renovar recetas, por favor pida a su farmacia que se ponga en contacto con nuestra oficina. Nuestro nmero de fax es el [phone].  Si tiene un asunto urgente cuando la clnica est cerrada y que no puede esperar hasta el siguiente da hbil, puede llamar/localizar a su doctor(a) al nmero que aparece a continuacin.   Por favor, tenga en cuenta que aunque hacemos todo lo posible para estar disponibles para asuntos urgentes fuera del horario de oficina, no estamos disponibles las 24 horas del da, los 7 das de la semana.   Si tiene un problema urgente y no puede comunicarse con nosotros, puede optar por buscar atencin mdica  en el consultorio de  su doctor(a), en una clnica privada, en un centro de atencin urgente o en una sala de emergencias.  Si tiene una emergencia mdica, por favor llame inmediatamente al 911 o vaya a la sala de emergencias.  Nmeros de bper  - Dr. Kowalski: [phone]  - Dra. Moye: [phone]  - Dra. Stewart: [phone]  En caso de inclemencias del tiempo, por favor llame a nuestra lnea principal al [phone] para una actualizacin sobre el estado de cualquier retraso o cierre.  Consejos para la medicacin en dermatologa: Por favor, guarde las cajas en las que vienen los medicamentos de uso tpico para ayudarle a seguir las instrucciones sobre dnde y cmo usarlos. Las farmacias generalmente imprimen las instrucciones del medicamento slo en las cajas y no directamente en los tubos del medicamento.   Si su medicamento es muy caro, por favor, pngase en contacto con nuestra oficina llamando al [phone] y presione la opcin 4 o envenos un mensaje a travs de MyChart.   No podemos decirle cul ser su copago por los medicamentos por adelantado ya que esto es diferente dependiendo de la cobertura de su seguro. Sin embargo, es posible que podamos encontrar un medicamento sustituto a menor costo o llenar un formulario para que el seguro cubra el medicamento que se considera necesario.   Si se requiere una autorizacin previa para que su compaa de seguros cubra su medicamento, por favor permtanos de 1 a 2 das hbiles para completar este proceso.  Los precios de los medicamentos varan con frecuencia dependiendo del lugar de dnde se surte la receta y alguna farmacias pueden ofrecer precios ms baratos.  El sitio web www.goodrx.com tiene cupones para medicamentos de diferentes farmacias. Los precios aqu no tienen en cuenta lo que podra costar con la ayuda del seguro (puede ser ms barato con su seguro), pero el sitio web puede darle el precio si no utiliz ningn seguro.  - Puede imprimir el  cupn correspondiente y llevarlo con su receta a la farmacia.  - Tambin puede pasar por nuestra oficina durante el horario de atencin regular y recoger una tarjeta de cupones de GoodRx.  - Si necesita que su receta se enve electrnicamente a una farmacia diferente, informe a nuestra oficina a travs de MyChart de [place] o por telfono llamando al [phone] y presione la opcin 4.  
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
## 2022-07-11 MED ORDER — TRETINOIN 0.05 % EX CREA
TOPICAL_CREAM | CUTANEOUS | 2 refills | Status: AC
Start: 2022-07-11 — End: ?

## 2023-03-21 ENCOUNTER — Ambulatory Visit: Payer: 59 | Admitting: Dermatology

## 2023-05-15 ENCOUNTER — Ambulatory Visit: Payer: 59 | Admitting: Dermatology

## 2023-05-15 ENCOUNTER — Encounter: Payer: Self-pay | Admitting: Dermatology

## 2023-05-15 DIAGNOSIS — L738 Other specified follicular disorders: Secondary | ICD-10-CM

## 2023-05-15 DIAGNOSIS — L820 Inflamed seborrheic keratosis: Secondary | ICD-10-CM

## 2023-05-15 DIAGNOSIS — Z85828 Personal history of other malignant neoplasm of skin: Secondary | ICD-10-CM | POA: Diagnosis not present

## 2023-05-15 DIAGNOSIS — L821 Other seborrheic keratosis: Secondary | ICD-10-CM | POA: Diagnosis not present

## 2023-05-15 DIAGNOSIS — L814 Other melanin hyperpigmentation: Secondary | ICD-10-CM

## 2023-05-15 NOTE — Patient Instructions (Signed)
 Cryotherapy Aftercare  Wash gently with soap and water everyday.   Apply Vaseline Jelly daily until healed.     Recommend daily broad spectrum sunscreen SPF 30+ to sun-exposed areas, reapply every 2 hours as needed. Call for new or changing lesions.  Staying in the shade or wearing long sleeves, sun glasses (UVA+UVB protection) and wide brim hats (4-inch brim around the entire circumference of the hat) are also recommended for sun protection.      Due to recent changes in healthcare laws, you may see results of your pathology and/or laboratory studies on MyChart before the doctors have had a chance to review them. We understand that in some cases there may be results that are confusing or concerning to you. Please understand that not all results are received at the same time and often the doctors may need to interpret multiple results in order to provide you with the best plan of care or course of treatment. Therefore, we ask that you please give [place] 2 business days to thoroughly review all your results before contacting the office for clarification. Should we see a critical lab result, you will be contacted sooner.   If You Need Anything After Your Visit  If you have any questions or concerns for your doctor, please call our main line at [phone] and press option 4 to reach your doctor's medical assistant. If no one answers, please leave a voicemail as directed and we will return your call as soon as possible. Messages left after 4 pm will be answered the following business day.   You may also send [place] a message via MyChart. We typically respond to MyChart messages within 1-2 business days.  For prescription refills, please ask your pharmacy to contact our office. Our fax number is [phone].  If you have an urgent issue when the clinic is closed that cannot wait until the next business day, you can page your doctor at the number below.    Please note that while we do our best to be  available for urgent issues outside of office hours, we are not available 24/7.   If you have an urgent issue and are unable to reach [place], you may choose to seek medical care at your doctor's office, retail clinic, urgent care center, or emergency room.  If you have a medical emergency, please immediately call 911 or go to the emergency department.  Pager Numbers  - Dr. Gwen Pounds: [phone]  - Dr. Roseanne Reno: [phone]  - Dr. Katrinka Blazing: [phone]   In the event of inclement weather, please call our main line at [phone] for an update on the status of any delays or closures.  Dermatology Medication Tips: Please keep the boxes that topical medications come in in order to help keep track of the instructions about where and how to use these. Pharmacies typically print the medication instructions only on the boxes and not directly on the medication tubes.   If your medication is too expensive, please contact our office at [phone] option 4 or send [place] a message through MyChart.   We are unable to tell what your co-pay for medications will be in advance as this is different depending on your insurance coverage. However, we may be able to find a substitute medication at lower cost or fill out paperwork to get insurance to cover a needed medication.   If a prior authorization is required to get your medication covered by your insurance company, please allow [place] 1-2 business days to complete  this process.  Drug prices often vary depending on where the prescription is filled and some pharmacies may offer cheaper prices.  The website www.goodrx.com contains coupons for medications through different pharmacies. The prices here do not account for what the cost may be with help from insurance (it may be cheaper with your insurance), but the website can give you the price if you did not use any insurance.  - You can print the associated coupon and take it with your prescription to the pharmacy.   - You may also stop by our office during regular business hours and pick up a GoodRx coupon card.  - If you need your prescription sent electronically to a different pharmacy, notify our office through [facility] or by phone at [phone] option 4.     Si Usted Necesita Algo Despus de Su Visita  Tambin puede enviarnos un mensaje a travs de Clinical cytogeneticist. Por lo general respondemos a los mensajes de MyChart en el transcurso de 1 a 2 das hbiles.  Para renovar recetas, por favor pida a su farmacia que se ponga en contacto con nuestra oficina. Annie Sable de fax es [place] [phone].  Si tiene un asunto urgente cuando la clnica est cerrada y que no puede esperar hasta el siguiente da hbil, puede llamar/localizar a su doctor(a) al nmero que aparece a continuacin.   Por favor, tenga en cuenta que aunque hacemos todo lo posible para estar disponibles para asuntos urgentes fuera del horario de [place], no estamos disponibles las 24 horas del da, los 7 [address]  [address] de la [place].   Si tiene un problema urgente y no puede comunicarse con nosotros, puede optar por buscar atencin mdica  en el consultorio de su doctor(a), en una clnica privada, en un centro de atencin urgente o en una sala de emergencias.  Si tiene Engineer, drilling, por favor llame inmediatamente al 911 o vaya a la sala de emergencias.  Nmeros de bper  - Dr. Gwen Pounds: [phone]  - Dra. Roseanne Reno: [phone]  - Dr. Katrinka Blazing: [phone]   En caso de inclemencias del tiempo, por favor llame a Lacy Duverney principal al [phone] para una actualizacin sobre el [place] de cualquier retraso o cierre.  Consejos para la medicacin en dermatologa: Por favor, guarde las cajas en las que vienen los medicamentos de uso tpico para ayudarle a seguir las instrucciones sobre dnde y cmo usarlos. Las farmacias generalmente imprimen las instrucciones del medicamento slo en las cajas y no directamente en los tubos del  [place].   Si su medicamento es muy caro, por favor, pngase en contacto con Rolm [place] llamando al [phone] y presione la opcin 4 o envenos un mensaje a travs de Clinical cytogeneticist.   No podemos decirle cul ser su copago por los medicamentos por adelantado ya que esto es diferente dependiendo de la cobertura de su seguro. Sin embargo, es posible que podamos encontrar un medicamento sustituto a Audiological scientist un formulario para que el seguro cubra el medicamento que se considera necesario.   Si se requiere una autorizacin previa para que su compaa de seguros [place] su medicamento, por favor permtanos de 1 a 2 das hbiles para completar [address]  Los precios de los medicamentos varan con frecuencia dependiendo del Environmental consultant de dnde se surte la receta y alguna farmacias pueden ofrecer precios ms baratos.  El sitio web www.goodrx.com tiene cupones para medicamentos de Health and safety inspector. Los precios aqu no tienen en cuenta lo que podra costar con la ayuda del  seguro (puede ser ms barato con su seguro), pero el sitio web puede darle el precio si no Visual merchandiser.  - Puede imprimir el cupn correspondiente y llevarlo con su receta a la farmacia.  - Tambin puede pasar por nuestra oficina durante el horario de atencin regular y Education officer, museum una tarjeta de cupones de GoodRx.  - Si necesita que su receta se enve electrnicamente a una farmacia diferente, informe a nuestra oficina a travs de MyChart de [place] o por telfono llamando al [phone] y presione la opcin 4.

## 2023-05-15 NOTE — Progress Notes (Unsigned)
   Follow-Up Visit   Subjective  Carrie Soto is a 63 y.o. female who presents for the following: Spots of concern. Frontal scalp, left medial cheek, upper forehead; raised, crusted. Mole on left side that is changing colors. Mole on back that itches from time to time.   The patient has spots, moles and lesions to be evaluated, some may be new or changing and the patient may have concern these could be cancer.    The following portions of the chart were reviewed this encounter and updated as appropriate: medications, allergies, medical history  Review of Systems:  No other skin or systemic complaints except as noted in HPI or Assessment and Plan.  Objective  Well appearing patient in no apparent distress; mood and affect are within normal limits.  A focused examination was performed of the following areas: Scalp, face, abdomen, back  Relevant physical exam findings are noted in the Assessment and Plan.  Frontal Scalp x1, Left medial cheek x1, L lower flank at waist line x1, L back x2 (5) Erythematous keratotic or waxy stuck-on papule  Assessment & Plan   HISTORY OF BASAL CELL CARCINOMA OF THE SKIN - No evidence of recurrence today - Recommend regular full body skin exams - Recommend daily broad spectrum sunscreen SPF 30+ to sun-exposed areas, reapply every 2 hours as needed.  - Call if any new or changing lesions are noted between office visits  SEBORRHEIC KERATOSIS - Stuck-on, waxy, tan-brown papules and/or plaques trunk - Benign-appearing - Discussed benign etiology and prognosis. - Observe - Call for any changes  LENTIGINES Exam: scattered tan macules Due to sun exposure Treatment Plan: Benign-appearing, observe. Recommend daily broad spectrum sunscreen SPF 30+ to sun-exposed areas, reapply every 2 hours as needed.  Call for any changes    INFLAMED SEBORRHEIC KERATOSIS (5) Frontal Scalp x1, Left medial cheek x1, L lower flank at waist line x1, L back x2  (5) Symptomatic, irritating, patient would like treated.  Recheck scalp at follow up Destruction of lesion - Frontal Scalp x1, Left medial cheek x1, L lower flank at waist line x1, L back x2 (5)  Destruction method: cryotherapy   Informed consent: discussed and consent obtained   Lesion destroyed using liquid nitrogen: Yes   Region frozen until ice ball extended beyond lesion: Yes   Outcome: patient tolerated procedure well with no complications   Post-procedure details: wound care instructions given   Additional details:  Prior to procedure, discussed risks of blister formation, small wound, skin dyspigmentation, or rare scar following cryotherapy. Recommend Vaseline ointment to treated areas while healing.   Dilated pore of Winer  Exam Open pore/large comedone at central upper forehead   Treatment:  Benign, observe.  Can remove with punch removal/surgery if patient desires. Patient deferred treatment at this time. Contents partially expressed today.   Return for TBSE As Scheduled, With Dr. Roseanne Reno.  I, Lawson Radar, CMA, am acting as scribe for Willeen Niece, MD.   Documentation: I have reviewed the above documentation for accuracy and completeness, and I agree with the above.  Willeen Niece, MD

## 2023-07-17 ENCOUNTER — Ambulatory Visit: Payer: 59 | Admitting: Dermatology

## 2023-08-08 ENCOUNTER — Encounter: Payer: Self-pay | Admitting: Dermatology

## 2023-08-08 ENCOUNTER — Ambulatory Visit: Admitting: Dermatology

## 2023-08-08 DIAGNOSIS — Z1283 Encounter for screening for malignant neoplasm of skin: Secondary | ICD-10-CM

## 2023-08-08 DIAGNOSIS — W908XXA Exposure to other nonionizing radiation, initial encounter: Secondary | ICD-10-CM

## 2023-08-08 DIAGNOSIS — L821 Other seborrheic keratosis: Secondary | ICD-10-CM

## 2023-08-08 DIAGNOSIS — L820 Inflamed seborrheic keratosis: Secondary | ICD-10-CM

## 2023-08-08 DIAGNOSIS — C4492 Squamous cell carcinoma of skin, unspecified: Secondary | ICD-10-CM

## 2023-08-08 DIAGNOSIS — L578 Other skin changes due to chronic exposure to nonionizing radiation: Secondary | ICD-10-CM

## 2023-08-08 DIAGNOSIS — D239 Other benign neoplasm of skin, unspecified: Secondary | ICD-10-CM

## 2023-08-08 DIAGNOSIS — D044 Carcinoma in situ of skin of scalp and neck: Secondary | ICD-10-CM

## 2023-08-08 DIAGNOSIS — D492 Neoplasm of unspecified behavior of bone, soft tissue, and skin: Secondary | ICD-10-CM

## 2023-08-08 DIAGNOSIS — L918 Other hypertrophic disorders of the skin: Secondary | ICD-10-CM

## 2023-08-08 DIAGNOSIS — D2271 Melanocytic nevi of right lower limb, including hip: Secondary | ICD-10-CM

## 2023-08-08 DIAGNOSIS — L814 Other melanin hyperpigmentation: Secondary | ICD-10-CM

## 2023-08-08 DIAGNOSIS — Z872 Personal history of diseases of the skin and subcutaneous tissue: Secondary | ICD-10-CM

## 2023-08-08 DIAGNOSIS — D1801 Hemangioma of skin and subcutaneous tissue: Secondary | ICD-10-CM

## 2023-08-08 DIAGNOSIS — Z85828 Personal history of other malignant neoplasm of skin: Secondary | ICD-10-CM

## 2023-08-08 DIAGNOSIS — D229 Melanocytic nevi, unspecified: Secondary | ICD-10-CM

## 2023-08-08 DIAGNOSIS — L738 Other specified follicular disorders: Secondary | ICD-10-CM

## 2023-08-08 HISTORY — DX: Squamous cell carcinoma of skin, unspecified: C44.92

## 2023-08-08 NOTE — Progress Notes (Signed)
 Follow-Up Visit   Subjective  Carrie Soto is a 63 y.o. female who presents for the following: Skin Cancer Screening and Full Body Skin Exam hx of BCCs, Aks, , ISK frontal scalp LN2 05/15/23 not resolved, check mole R back, itchy  The patient presents for Total-Body Skin Exam (TBSE) for skin cancer screening and mole check. The patient has spots, moles and lesions to be evaluated, some may be new or changing and the patient may have concern these could be cancer.    The following portions of the chart were reviewed this encounter and updated as appropriate: medications, allergies, medical history  Review of Systems:  No other skin or systemic complaints except as noted in HPI or Assessment and Plan.  Objective  Well appearing patient in no apparent distress; mood and affect are within normal limits.  A full examination was performed including scalp, head, eyes, ears, nose, lips, neck, chest, axillae, abdomen, back, buttocks, bilateral upper extremities, bilateral lower extremities, hands, feet, fingers, toes, fingernails, and toenails. All findings within normal limits unless otherwise noted below.   Relevant physical exam findings are noted in the Assessment and Plan.  L frontal scalp 7.79mm pink keratotic pap  Right Upper Back x 1 Stuck on waxy paps with erythema  Assessment & Plan   SKIN CANCER SCREENING PERFORMED TODAY.  ACTINIC DAMAGE - Chronic condition, secondary to cumulative UV/sun exposure - diffuse scaly erythematous macules with underlying dyspigmentation - Recommend daily broad spectrum sunscreen SPF 30+ to sun-exposed areas, reapply every 2 hours as needed.  - Staying in the shade or wearing long sleeves, sun glasses (UVA+UVB protection) and wide brim hats (4-inch brim around the entire circumference of the hat) are also recommended for sun protection.  - Call for new or changing lesions.  LENTIGINES, SEBORRHEIC KERATOSES, HEMANGIOMAS - Benign normal skin  lesions - Benign-appearing - Call for any changes  MELANOCYTIC NEVI - Tan-brown and/or pink-flesh-colored symmetric macules and papules - Benign appearing on exam today - Observation - Call clinic for new or changing moles - Recommend daily use of broad spectrum spf 30+ sunscreen to sun-exposed areas.  - 5 mm brown papule right calf   Sebaceous Hyperplasia vs fibrous papule  L ant alar crease Exam: 2 mm flesh white papule at left anterior alar crease   Treatment Plan: Benign-appearing. Stable compared to previous visit. Observation.  Call clinic for new or changing moles.  Recommend daily use of broad spectrum spf 30+ sunscreen to sun-exposed areas.   Dilated pore of Winer right paranasal and upper forehead Exam: Dilated pores   Treatment: Benign observe   HISTORY OF BASAL CELL CARCINOMA OF THE SKIN - No evidence of recurrence today- Left lateral eyebrow, Right anterior nasal ala  Right postauricular, R infranasal - Recommend regular full body skin exams - Recommend daily broad spectrum sunscreen SPF 30+ to sun-exposed areas, reapply every 2 hours as needed.  - Call if any new or changing lesions are noted between office visits   HISTORY OF PRECANCEROUS ACTINIC KERATOSIS - site(s) of PreCancerous Actinic Keratosis clear today. - these may recur and new lesions may form requiring treatment to prevent transformation into skin cancer - observe for new or changing spots and contact [facility] for appointment if occur - photoprotection with sun protective clothing; sunglasses and broad spectrum sunscreen with SPF of at least 30 + and frequent self skin exams recommended - yearly exams by a dermatologist recommended for persons with history of PreCancerous Actinic Keratoses  Acrochordons (Skin Tags) L medial thigh - Fleshy, skin-colored pedunculated papule - Benign appearing.  - Observe. - If desired, they can be removed with an in office procedure that is not covered  by insurance. - Please call the clinic if you notice any new or changing lesions.  NEOPLASM OF SKIN L frontal scalp Epidermal / dermal shaving  Lesion diameter (cm):  0.7 Informed consent: discussed and consent obtained   Patient was prepped and draped in usual sterile fashion: area prepped with alcohol. Anesthesia: the lesion was anesthetized in a standard fashion   Anesthetic:  1% lidocaine w/ epinephrine 1-100,000 buffered w/ 8.4% NaHCO3 Instrument used: flexible razor blade   Hemostasis achieved with: pressure, aluminum chloride and electrodesiccation   Outcome: patient tolerated procedure well   Post-procedure details: wound care instructions given   Post-procedure details comment:  Ointment and small bandage applied Additional details:  Post txt defect 0.8cm Specimen 1 - Surgical pathology Differential Diagnosis: ISK r/o SCC  Check Margins: yes 7.63mm pink keratotic pap Discussed treatment options if skin cancer, EDC vs mohs with Dr. Fain Home INFLAMED SEBORRHEIC KERATOSIS Right Upper Back x 1 Symptomatic, irritating, patient would like treated. Destruction of lesion - Right Upper Back x 1  Destruction method: cryotherapy   Informed consent: discussed and consent obtained   Lesion destroyed using liquid nitrogen: Yes   Region frozen until ice ball extended beyond lesion: Yes   Outcome: patient tolerated procedure well with no complications   Post-procedure details: wound care instructions given   Additional details:  Prior to procedure, discussed risks of blister formation, small wound, skin dyspigmentation, or rare scar following cryotherapy. Recommend Vaseline ointment to treated areas while healing.   Return in about 1 year (around 08/07/2024) for TBSE, Hx of BCC, Hx of AKs.  I, Rollie Clipper, RMA, am acting as scribe for Artemio Larry, MD .   Documentation: I have reviewed the above documentation for accuracy and completeness, and I agree with the above.  Artemio Larry,  MD

## 2023-08-08 NOTE — Patient Instructions (Addendum)

## 2023-08-10 LAB — SURGICAL PATHOLOGY

## 2023-08-11 IMAGING — MG MM DIGITAL DIAGNOSTIC UNILAT*R* W/ TOMO W/ CAD
6 series · 6 of 14 positions shown · non-contrast
Comparison: Previous exam(s).

CLINICAL DATA: Callback from screening mammogram for calcifications
right breast

EXAM:
DIGITAL DIAGNOSTIC UNILATERAL RIGHT MAMMOGRAM WITH TOMOSYNTHESIS AND
CAD
TECHNIQUE: Right digital diagnostic mammography and breast tomosynthesis was
performed. The images were evaluated with computer-aided detection.

[R CC]
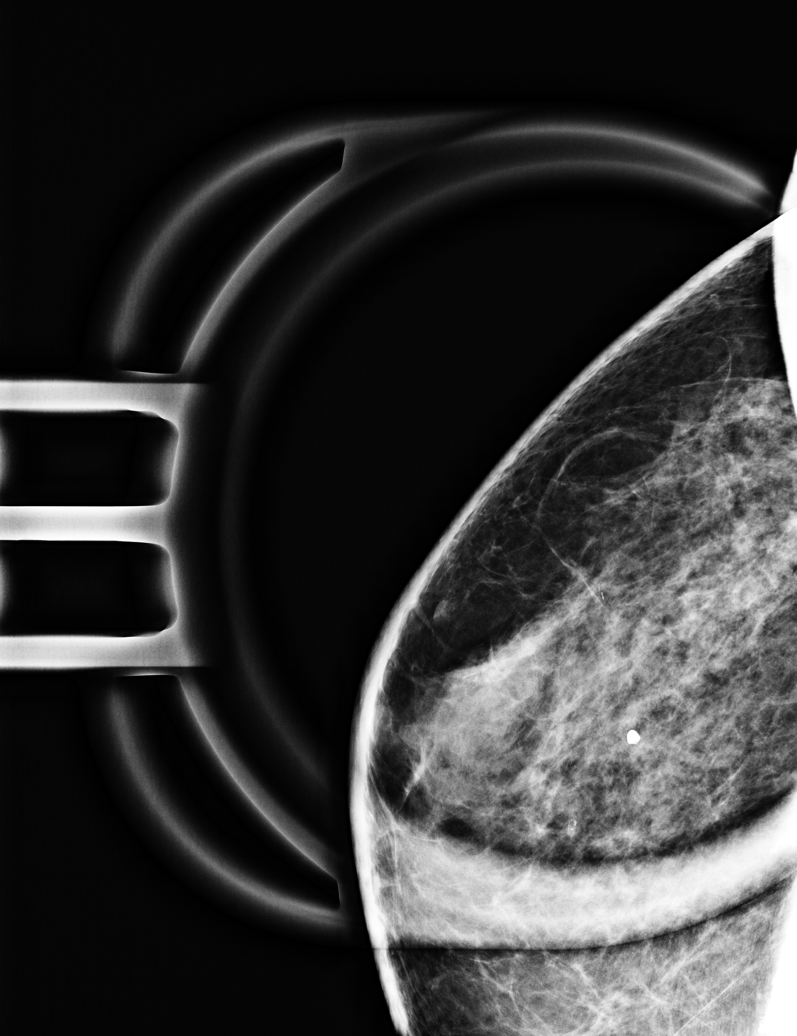

[R ML]
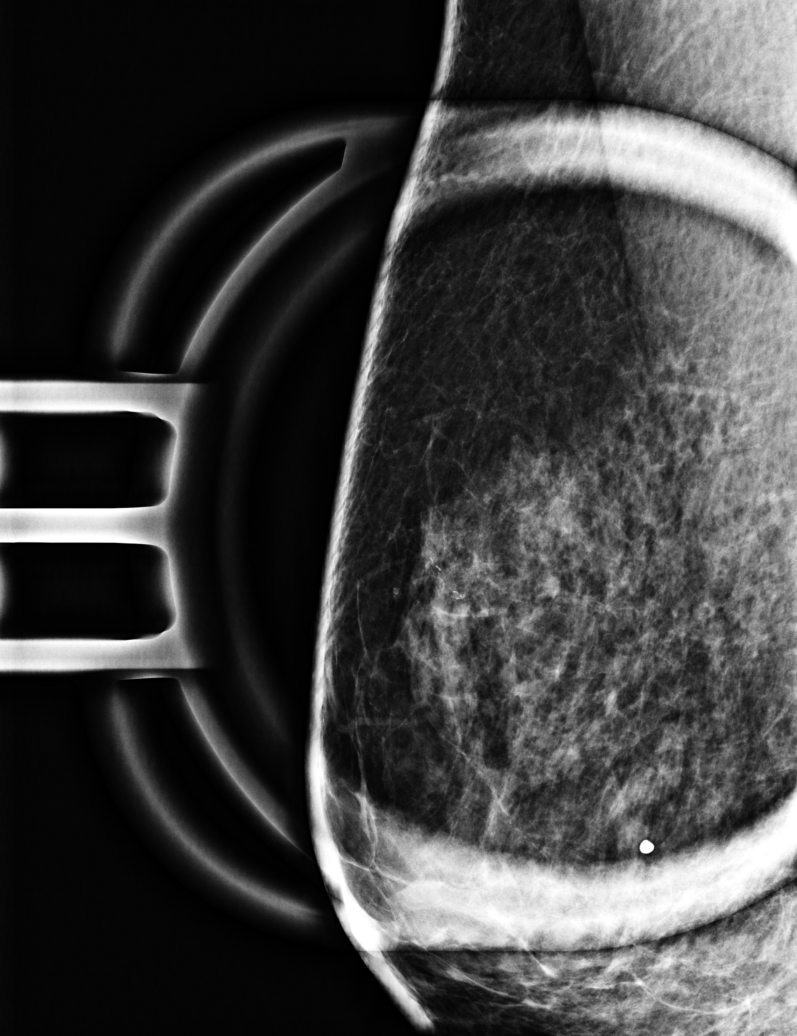

[R ML synth-2D (1 of 2)]
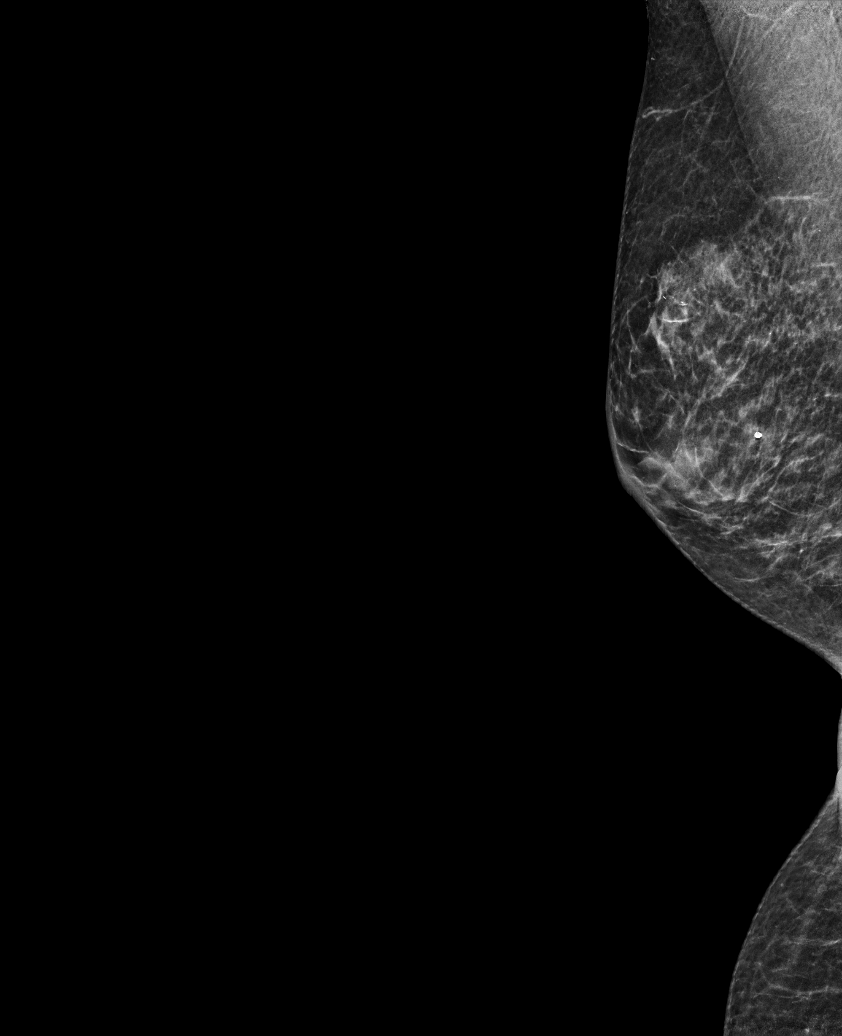

[R ML synth-2D (2 of 2)]
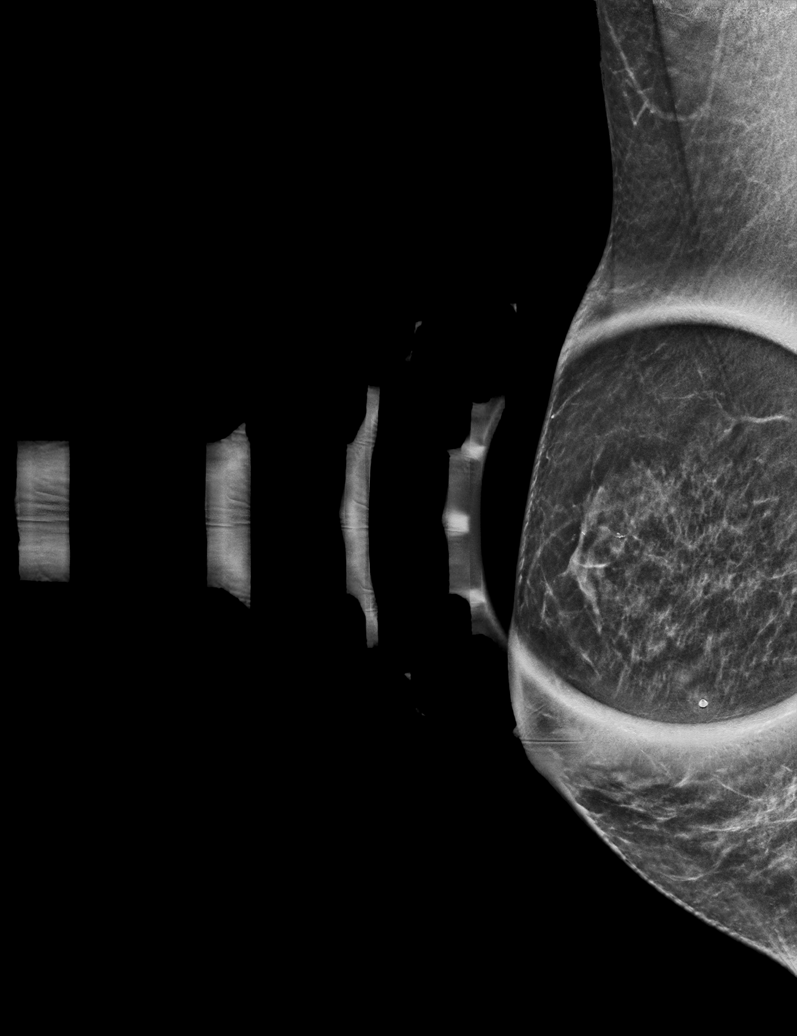

[R ML tomo (1 of 2) · tomo slice 26/51.0]
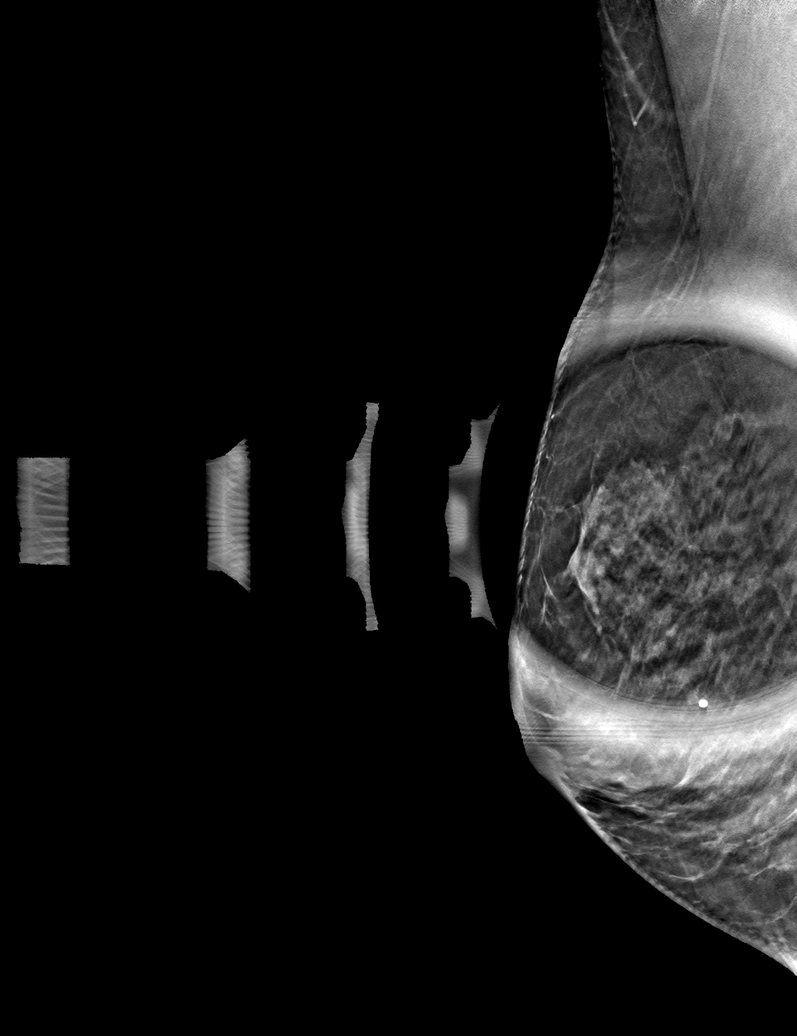

[R ML tomo (2 of 2) · tomo slice 25/50.0]
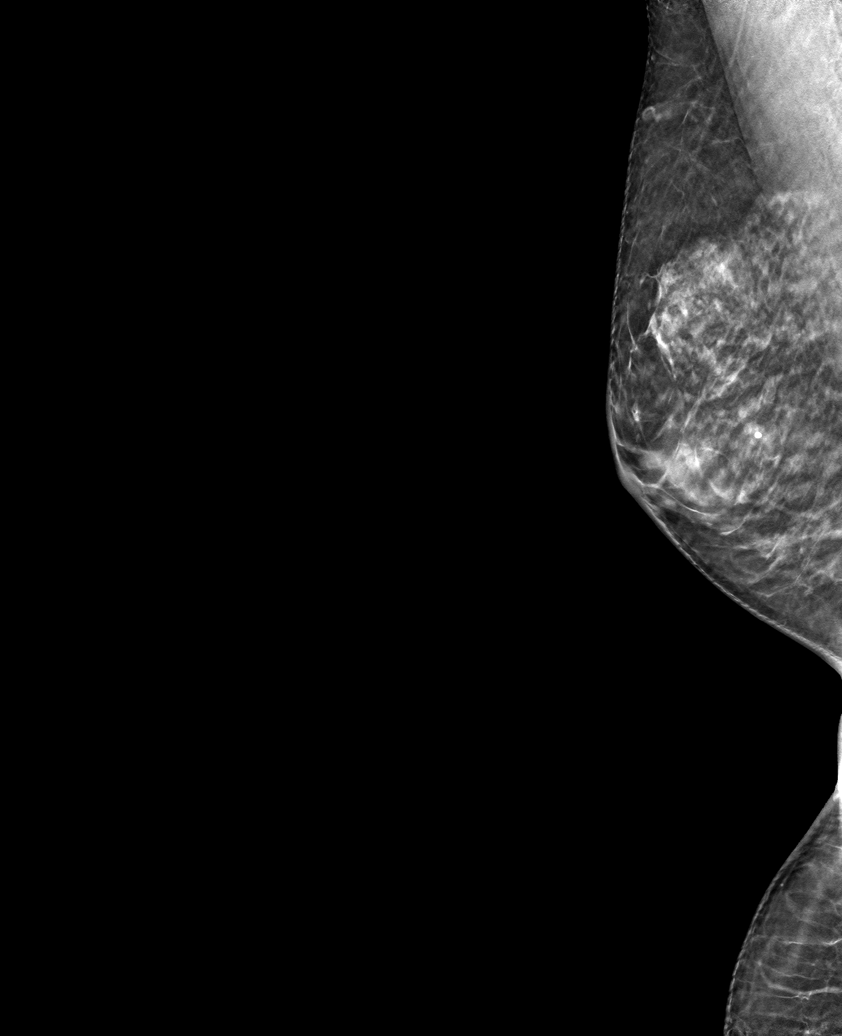

[6 of 14 positions shown; findings below may reference images not displayed]

ACR Breast Density Category c: The breast tissue is heterogeneously
dense, which may obscure small masses.
FINDINGS: Spot magnification cc and lateral views of the right breast, lateral
view of right breast, spot compression tomographic lateral view of
right breast are submitted. The previously noted calcifications in
the upper right breast are probably vascular calcifications with
tram track appearance.
IMPRESSION: Probable benign findings.

RECOMMENDATION:
Six-month follow-up mammogram right breast.

I have discussed the findings and recommendations with the patient.
If applicable, a reminder letter will be sent to the patient
regarding the next appointment.

BI-RADS CATEGORY  3: Probably benign.

## 2023-08-13 ENCOUNTER — Encounter: Payer: Self-pay | Admitting: Dermatology

## 2023-08-13 ENCOUNTER — Ambulatory Visit: Payer: Self-pay | Admitting: Dermatology

## 2023-08-13 NOTE — Telephone Encounter (Signed)
-----   Message from Carrie Soto sent at 08/13/2023 12:46 PM EDT ----- 1. Skin, L frontal scalp :       BOWENOID SQUAMOUS CELL CARCINOMA IN SITU   SCCIS skin cancer- recommend EDC - please call patient

## 2023-08-13 NOTE — Telephone Encounter (Signed)
 Advised patient biopsy of the left frontal scalp was BOWENOID SQUAMOUS CELL CARCINOMA IN SITU and needs EDC, scheduled 08/29/23 9:30 AM.

## 2023-08-29 ENCOUNTER — Encounter: Payer: Self-pay | Admitting: Dermatology

## 2023-08-29 ENCOUNTER — Ambulatory Visit: Admitting: Dermatology

## 2023-08-29 DIAGNOSIS — W908XXA Exposure to other nonionizing radiation, initial encounter: Secondary | ICD-10-CM | POA: Diagnosis not present

## 2023-08-29 DIAGNOSIS — L578 Other skin changes due to chronic exposure to nonionizing radiation: Secondary | ICD-10-CM | POA: Diagnosis not present

## 2023-08-29 DIAGNOSIS — Z85828 Personal history of other malignant neoplasm of skin: Secondary | ICD-10-CM

## 2023-08-29 DIAGNOSIS — D044 Carcinoma in situ of skin of scalp and neck: Secondary | ICD-10-CM | POA: Diagnosis not present

## 2023-08-29 DIAGNOSIS — D099 Carcinoma in situ, unspecified: Secondary | ICD-10-CM

## 2023-08-29 NOTE — Progress Notes (Signed)
   Follow-Up Visit   Subjective  Carrie Soto is a 63 y.o. female who presents for the following:  treatment of Bowenoid SCCis at left frontal scalp. Bx 08/08/2023.  The patient has spots, moles and lesions to be evaluated, some may be new or changing and the patient may have concern these could be cancer.    The following portions of the chart were reviewed this encounter and updated as appropriate: medications, allergies, medical history  Review of Systems:  No other skin or systemic complaints except as noted in HPI or Assessment and Plan.  Objective  Well appearing patient in no apparent distress; mood and affect are within normal limits.  A focused examination was performed of the following areas: Scalp  Relevant physical exam findings are noted in the Assessment and Plan.  Left Frontal Scalp Healing biopsy site with scab  Assessment & Plan   SQUAMOUS CELL CARCINOMA IN SITU Left Frontal Scalp Destruction of lesion  Destruction method: electrodesiccation and curettage   Timeout:  patient name, date of birth, surgical site, and procedure verified Anesthesia: the lesion was anesthetized in a standard fashion   Anesthetic:  1% lidocaine w/ epinephrine 1-100,000 buffered w/ 8.4% NaHCO3 Curettage performed in three different directions: Yes   Electrodesiccation performed over the curetted area: Yes   Curettage cycles:  3 Lesion length (cm):  0.8 Lesion width (cm):  0.8 Final wound size (cm):  1.1 Hemostasis achieved with:  pressure, aluminum chloride and electrodesiccation Outcome: patient tolerated procedure well with no complications   Post-procedure details: wound care instructions given   Additional details:  Post treatment defect 1.1 x 0.8 cm  ACTINIC DAMAGE - chronic, secondary to cumulative UV radiation exposure/sun exposure over time - diffuse scaly erythematous macules with underlying dyspigmentation - Recommend daily broad spectrum sunscreen SPF 30+ to  sun-exposed areas, reapply every 2 hours as needed.  - Recommend staying in the shade or wearing long sleeves, sun glasses (UVA+UVB protection) and wide brim hats (4-inch brim around the entire circumference of the hat). - Call for new or changing lesions.   HISTORY OF BASAL CELL CARCINOMA OF THE SKIN - No evidence of recurrence today - Recommend regular full body skin exams - Recommend daily broad spectrum sunscreen SPF 30+ to sun-exposed areas, reapply every 2 hours as needed.  - Call if any new or changing lesions are noted between office visits   Return in about 6 months (around 02/28/2024) for SCCis follow up.  I, Jill Parcell, CMA, am acting as scribe for Rexene Rattler, MD.   Documentation: I have reviewed the above documentation for accuracy and completeness, and I agree with the above.  Rexene Rattler, MD

## 2023-08-29 NOTE — Patient Instructions (Signed)

## 2024-03-10 ENCOUNTER — Ambulatory Visit: Admitting: Dermatology

## 2024-03-10 DIAGNOSIS — W908XXA Exposure to other nonionizing radiation, initial encounter: Secondary | ICD-10-CM | POA: Diagnosis not present

## 2024-03-10 DIAGNOSIS — L578 Other skin changes due to chronic exposure to nonionizing radiation: Secondary | ICD-10-CM

## 2024-03-10 DIAGNOSIS — Z86007 Personal history of in-situ neoplasm of skin: Secondary | ICD-10-CM | POA: Diagnosis not present

## 2024-03-10 DIAGNOSIS — L821 Other seborrheic keratosis: Secondary | ICD-10-CM

## 2024-03-10 DIAGNOSIS — L729 Follicular cyst of the skin and subcutaneous tissue, unspecified: Secondary | ICD-10-CM

## 2024-03-10 DIAGNOSIS — L820 Inflamed seborrheic keratosis: Secondary | ICD-10-CM | POA: Diagnosis not present

## 2024-03-10 NOTE — Patient Instructions (Addendum)
 Seborrheic Keratosis  What causes seborrheic keratoses? Seborrheic keratoses are harmless, common skin growths that first appear during adult life.  As time goes by, more growths appear.  Some people may develop a large number of them.  Seborrheic keratoses appear on both covered and uncovered body parts.  They are not caused by sunlight.  The tendency to develop seborrheic keratoses can be inherited.  They vary in color from skin-colored to gray, brown, or even black.  They can be either smooth or have a rough, warty surface.   Seborrheic keratoses are superficial and look as if they were stuck on the skin.  Under the microscope this type of keratosis looks like layers upon layers of skin.  That is why at times the top layer may seem to fall off, but the rest of the growth remains and re-grows.    Treatment Seborrheic keratoses do not need to be treated, but can easily be removed in the office.  Seborrheic keratoses often cause symptoms when they rub on clothing or jewelry.  Lesions can be in the way of shaving.  If they become inflamed, they can cause itching, soreness, or burning.  Removal of a seborrheic keratosis can be accomplished by freezing, burning, or surgery. If any spot bleeds, scabs, or grows rapidly, please return to have it checked, as these can be an indication of a skin cancer.   Cryotherapy Aftercare  Wash gently with soap and water everyday.   Apply Vaseline and Band-Aid daily until healed.     Due to recent changes in healthcare laws, you may see results of your pathology and/or laboratory studies on MyChart before the doctors have had a chance to review them. We understand that in some cases there may be results that are confusing or concerning to you. Please understand that not all results are received at the same time and often the doctors may need to interpret multiple results in order to provide you with the best plan of care or course of treatment. Therefore, we ask  that you please give us  2 business days to thoroughly review all your results before contacting the office for clarification. Should we see a critical lab result, you will be contacted sooner.   If You Need Anything After Your Visit  If you have any questions or concerns for your doctor, please call our main line at [phone] and press option 4 to reach your doctor's medical assistant. If no one answers, please leave a voicemail as directed and we will return your call as soon as possible. Messages left after 4 pm will be answered the following business day.   You may also send us  a message via MyChart. We typically respond to MyChart messages within 1-2 business days.  For prescription refills, please ask your pharmacy to contact our office. Our fax number is [phone].  If you have an urgent issue when the clinic is closed that cannot wait until the next business day, you can page your doctor at the number below.    Please note that while we do our best to be available for urgent issues outside of office hours, we are not available 24/7.   If you have an urgent issue and are unable to reach us , you may choose to seek medical care at your doctor's office, retail clinic, urgent care center, or emergency room.  If you have a medical emergency, please immediately call 911 or go to the emergency department.  Pager Numbers  - Dr. Hester:  [phone]  - Dr. Jackquline: [phone]  - Dr. Claudene: [phone]   - Dr. Raymund: [phone]  In the event of inclement weather, please call our main line at [phone] for an update on the status of any delays or closures.  Dermatology Medication Tips: Please keep the boxes that topical medications come in in order to help keep track of the instructions about where and how to use these. Pharmacies typically print the medication instructions only on the boxes and not directly on the medication tubes.   If your medication is too expensive,  please contact our office at [phone] option 4 or send us  a message through MyChart.   We are unable to tell what your co-pay for medications will be in advance as this is different depending on your insurance coverage. However, we may be able to find a substitute medication at lower cost or fill out paperwork to get insurance to cover a needed medication.   If a prior authorization is required to get your medication covered by your insurance company, please allow us  1-2 business days to complete this process.  Drug prices often vary depending on where the prescription is filled and some pharmacies may offer cheaper prices.  The website www.goodrx.com contains coupons for medications through different pharmacies. The prices here do not account for what the cost may be with help from insurance (it may be cheaper with your insurance), but the website can give you the price if you did not use any insurance.  - You can print the associated coupon and take it with your prescription to the pharmacy.  - You may also stop by our office during regular business hours and pick up a GoodRx coupon card.  - If you need your prescription sent electronically to a different pharmacy, notify our office through [facility] or by phone at [phone] option 4.     Si Usted Necesita Algo Despus de Su Visita  Tambin puede enviarnos un mensaje a travs de Clinical cytogeneticist. Por lo general respondemos a los mensajes de MyChart en el transcurso de 1 a 2 das hbiles.  Para renovar recetas, por favor pida a su farmacia que se ponga en contacto con nuestra oficina. Randi lakes de fax es [place] [phone].  Si tiene un asunto urgente cuando la clnica est cerrada y que no puede esperar hasta el siguiente da hbil, puede llamar/localizar a su doctor(a) al nmero que aparece a continuacin.   Por favor, tenga en cuenta que aunque hacemos todo lo posible para estar disponibles para asuntos urgentes fuera del  horario de [place], no estamos disponibles las 24 horas del da, los 7 [address]  [address] de la [place].   Si tiene un problema urgente y no puede comunicarse con nosotros, puede optar por buscar atencin mdica  en el consultorio de su doctor(a), en una clnica privada, en un centro de atencin urgente o en una sala de emergencias.  Si tiene Engineer, drilling, por favor llame inmediatamente al 911 o vaya a la sala de emergencias.  Nmeros de bper  - Dr. Hester: [phone]  - Dra. Jackquline: [phone]  - Dr. Claudene: [phone]  - Dra. Kitts: [phone]  En caso de inclemencias del [place], por favor llame a nuestra lnea principal al [phone] para una actualizacin sobre el estado de cualquier retraso o cierre.  Consejos para la medicacin en dermatologa: Por favor, guarde las cajas en las que vienen los medicamentos de uso tpico para ayudarle a seguir las instrucciones sobre dnde y cmo  usarlos. Las farmacias generalmente imprimen las instrucciones del medicamento slo en las cajas y no directamente en los tubos del [place].   Si su medicamento es muy caro, por favor, pngase en contacto con landry rieger llamando al [phone] y presione la opcin 4 o envenos un mensaje a travs de Clinical cytogeneticist.   No podemos decirle cul ser su copago por los medicamentos por adelantado ya que esto es diferente dependiendo de la cobertura de su seguro. Sin embargo, es posible que podamos encontrar un medicamento sustituto a Audiological scientist un formulario para que el seguro cubra el medicamento que se considera necesario.   Si se requiere una autorizacin previa para que su compaa de seguros malta su medicamento, por favor permtanos de 1 a 2 das hbiles para completar este proceso.  Los precios de los medicamentos varan con frecuencia dependiendo del Environmental consultant de dnde se surte la receta y alguna farmacias pueden ofrecer precios ms baratos.  El sitio web www.goodrx.com tiene cupones para  medicamentos de Health and safety inspector. Los precios aqu no tienen en cuenta lo que podra costar con la ayuda del seguro (puede ser ms barato con su seguro), pero el sitio web puede darle el precio si no utiliz Tourist information centre manager.  - Puede imprimir el cupn correspondiente y llevarlo con su receta a la farmacia.  - Tambin puede pasar por nuestra oficina durante el horario de atencin regular y Education officer, museum una tarjeta de cupones de GoodRx.  - Si necesita que su receta se enve electrnicamente a una farmacia diferente, informe a nuestra oficina a travs de MyChart de [place] o por telfono llamando al [phone] y presione la opcin 4.

## 2024-03-10 NOTE — Progress Notes (Signed)
" ° °  Follow-Up Visit   Subjective  Carrie Soto is a 64 y.o. female who presents for the following: 6 month follow up on sccis at left frontal scalp treated with ED&C   Spots at neck that she would like checked, get itchy and irritated, would like them removed    The following portions of the chart were reviewed this encounter and updated as appropriate: medications, allergies, medical history  Review of Systems:  No other skin or systemic complaints except as noted in HPI or Assessment and Plan.  Objective  Well appearing patient in no apparent distress; mood and affect are within normal limits.   A focused examination was performed of the following areas: Scalp, neck   Relevant exam findings are noted in the Assessment and Plan.  right anterior neck x 1 Erythematous stuck-on, waxy papule or plaque left anterior neck 2.5 mm firm flesh papule with dilated pore  Assessment & Plan  HISTORY OF SQUAMOUS CELL CARCINOMA IN SITU OF THE SKIN 08/08/2023 Bowenoid sccis left front scalp - trx ED&C on 08/29/2023 - No evidence of recurrence today - Recommend regular full body skin exams - Recommend daily broad spectrum sunscreen SPF 30+ to sun-exposed areas, reapply every 2 hours as needed.  - Call if any new or changing lesions are noted between office visits  SEBORRHEIC KERATOSIS - Stuck-on, waxy, tan-brown papules and/or plaques  - Benign-appearing - Discussed benign etiology and prognosis. - Observe - Call for any changes  ACTINIC DAMAGE - chronic, secondary to cumulative UV radiation exposure/sun exposure over time - diffuse scaly erythematous macules with underlying dyspigmentation - Recommend daily broad spectrum sunscreen SPF 30+ to sun-exposed areas, reapply every 2 hours as needed.  - Recommend staying in the shade or wearing long sleeves, sun glasses (UVA+UVB protection) and wide brim hats (4-inch brim around the entire circumference of the hat). - Call for new or  changing lesions. INFLAMED SEBORRHEIC KERATOSIS right anterior neck x 1 Symptomatic, irritating, patient would like treated. - Destruction of lesion - right anterior neck x 1  Destruction method: cryotherapy   Informed consent: discussed and consent obtained   Lesion destroyed using liquid nitrogen: Yes   Region frozen until ice ball extended beyond lesion: Yes   Outcome: patient tolerated procedure well with no complications   Post-procedure details: wound care instructions given   Additional details:  Prior to procedure, discussed risks of blister formation, small wound, skin dyspigmentation, or rare scar following cryotherapy. Recommend Vaseline ointment to treated areas while healing.   FOLLICULAR CYST OF SKIN AND SUBCUTANEOUS TISSUE left anterior neck Vs Dilated Pore of WIner Symptomatic, irritating, patient would like treated.  Discussed extraction   - Acne/Milia surgery - left anterior neck Benign Destruction procedure for symptomatic milia/cysts: Procedure risks and benefits were discussed with the patient (bleeding, bruising, small scar) and verbal consent was obtained. Following alcohol prep of the skin, the left anterior neck was anesthetized with 1% lido/epi injection. Incision performed with 11 blade and lesion removed and destroyed with pressure and curretage.  Total #1 lesions destroyed,  .  Vaseline ointment was applied to each site. The patient tolerated the procedure well.     Return for keep followup on scheduled.  I, Eleanor Blush, CMA, am acting as scribe for Rexene Rattler, MD.   Documentation: I have reviewed the above documentation for accuracy and completeness, and I agree with the above.  Rexene Rattler, MD    "

## 2024-03-11 ENCOUNTER — Encounter: Payer: Self-pay | Admitting: Dermatology

## 2024-08-26 ENCOUNTER — Encounter: Admitting: Dermatology

## 2024-10-14 ENCOUNTER — Encounter: Admitting: Dermatology
